# Patient Record
Sex: Male | Born: 1970 | Race: White | Hispanic: No | Marital: Single | Smoking: Heavy tobacco smoker
Health system: Southern US, Community
[De-identification: ages and names within clinical notes are randomized; demographics above are authoritative.]

## PROBLEM LIST (undated history)

## (undated) DIAGNOSIS — K219 Gastro-esophageal reflux disease without esophagitis: Secondary | ICD-10-CM

## (undated) DIAGNOSIS — K922 Gastrointestinal hemorrhage, unspecified: Secondary | ICD-10-CM

## (undated) DIAGNOSIS — M7660 Achilles tendinitis, unspecified leg: Secondary | ICD-10-CM

## (undated) DIAGNOSIS — Z720 Tobacco use: Secondary | ICD-10-CM

## (undated) DIAGNOSIS — K5790 Diverticulosis of intestine, part unspecified, without perforation or abscess without bleeding: Secondary | ICD-10-CM

## (undated) DIAGNOSIS — E785 Hyperlipidemia, unspecified: Secondary | ICD-10-CM

## (undated) HISTORY — PX: NO PAST SURGERIES: SHX2092

---

## 2005-11-12 ENCOUNTER — Observation Stay (HOSPITAL_COMMUNITY): Admission: EM | Admit: 2005-11-12 | Discharge: 2005-11-13 | Payer: Self-pay | Admitting: Emergency Medicine

## 2007-06-14 ENCOUNTER — Ambulatory Visit: Payer: Self-pay

## 2008-05-31 ENCOUNTER — Emergency Department (HOSPITAL_COMMUNITY): Admission: EM | Admit: 2008-05-31 | Discharge: 2008-05-31 | Payer: Self-pay | Admitting: Emergency Medicine

## 2009-04-22 ENCOUNTER — Emergency Department: Payer: Self-pay | Admitting: Emergency Medicine

## 2009-09-16 ENCOUNTER — Emergency Department: Payer: Self-pay | Admitting: Emergency Medicine

## 2010-12-22 ENCOUNTER — Emergency Department: Payer: Self-pay | Admitting: Emergency Medicine

## 2011-02-06 NOTE — Discharge Summary (Signed)
NAME:  Stanley Leach, Stanley Leach       ACCOUNT NO.:  [id number]   MEDICAL RECORD NO.:  [id number]          PATIENT TYPE:  INP   LOCATION:  5504                         FACILITY:  [facility]   PHYSICIAN:  Jackie Plum, M.D.DATE OF BIRTH:  August 01, 1971   DATE OF ADMISSION:  11/11/2005  DATE OF DISCHARGE:  11/13/2005                                 DISCHARGE SUMMARY   DISCHARGE DIAGNOSES:  1.  Urticaria, resolved.      1.  ESR 80 mm/hour on November 11, 2005.  This was not repeated.          Outpatient follow-up recommended.      2.  Hepatitis panel negative.      3.  TSH low at 0.127.  The patient does not have any clinical evidence          of hyperthyroidism and therefore no treatment was initiated for this          patient.  The patient will need outpatient repeat of his TSH and          free T3 and T4 as needed and this has been told to the patient.      4.  Human immunodeficiency virus test was negative. Mono screen was also          negative.  2.  History of cigarette smoking.  3.  Abnormal TSH as noted above, outpatient recommended.   DISCHARGE LABORATORY DATA:  WBC count 10.6, hemoglobin 13.6, hematocrit  37.8, MCV 91, platelets 438.  Sodium 138, potassium 4.2, chloride 105, CO2  27, BUN 8, glucose 140, creatinine 0.9, albumin 3.2, calcium 9.0, phosphorus  3.1, TSH 0.127.   CONSULTANTS:  Not applicable.   PROCEDURE:  Not applicable.   CONDITION ON DISCHARGE:  Improved and satisfactory.   HISTORY OF PRESENT ILLNESS:  The patient presented with complaints of hives;  please see full details regarding H&P as documented by Dr. Lendell Caprice.  It was  not clear the inciting factor for this admission.  The patient had a  temperature of 100.3 and he was mildly tachycardic.  He has had some hives  on his skin which was more macular erythematous lesions and urticarial  pattern.  He was admitted for further evaluation because the patient had  complaint of his throat closing up.  The patient  was started on IV steroids  as well as H1 and H2 blockers with Pepcid and Benadryl.  Overnight, symptoms  improved and by this morning, the patient has not had any skin lesions on  his body.  He has been doing well.  He is deemed ready for discharge today.  The patient does not have a PCP.  He has been asked to follow up with  [facility] or a Airlie Blumenberg of choice in terms of further workup.  He is going  to have a prescription for Epipen, Pepcid, prednisone, and Benadryl for his  urticaria with instructions to use the Epipen if he feels that he is having  difficulty breathing especially with his throat closing up __________.  The  patient is discharged in stable and satisfactory condition.     Jackie Plum, M.D.  Electronically Signed    GO/MEDQ  D:  11/13/2005  T:  11/13/2005  Job:  [id number]

## 2011-02-06 NOTE — H&P (Signed)
NAME:  Stanley Leach, Stanley Leach       ACCOUNT NO.:  [id number]   MEDICAL RECORD NO.:  [id number]          PATIENT TYPE:  EMS   LOCATION:  MAJO                         FACILITY:  [facility]   PHYSICIAN:  Corinna L. Lendell Caprice, MDDATE OF BIRTH:  22-Apr-1971   DATE OF ADMISSION:  11/12/2005  DATE OF DISCHARGE:                                HISTORY & PHYSICAL   PRIMARY CARE PHYSICIAN:  The patient does not have one.   CHIEF COMPLAINT:  Hives.   HISTORY OF PRESENT ILLNESS:  The patient is a 40 year old white male in  general health with occasional tobacco use, who for the last 2-3 weeks has  been having problems with URI symptoms.  He has been doing relatively well,  although he continues to have some mild cough.  His girlfriend has had  similar symptoms and recently got a prescription for amoxicillin, which he  took as well.  The only other recent medication that he has been taking is  some Tylenol PM, but he has taken that before in the past.  His other  previous history is that of approximately 15 years ago he had a single onset  of generalized hives of unknown etiology.  He otherwise has no allergies as  far as he can tell.  Sometime yesterday, the patient started noting that he  started having raised red macular lesions which were pruritic on his elbows  and knees.  He became somewhat concerned, took some Benadryl and this went  away.  He then went to sleep and when he woke up in the morning, his hands  were more stiff and swollen and he became concerned and followed up with  Urgent Care.  When he saw them, his symptoms were improved.  He received  some steroids and Benadryl and symptoms started to improve; however, within  an hour's time, his symptoms started to return again.  This time, they  seemed a bit more prominent, this time involving more of his hands, knees,  thighs, feet and some on his abdomen.  He became concerned and he came into  the emergency room.  He received another dose of  steroids and Benadryl and  went to the emergency room, as was advised by Urgent Care.  In the emergency  room, the patient's symptoms were minimal and essentially asymptomatic at  that point.  He was evaluated by the ER attending physician at [facility].  The patient's labs were essentially unremarkable, although he did  note to have a slightly elevated white count at 12.2 with a 96% shift; the  rest of his labs were noted with just a slight bump in his transaminases,  but he did have an elevated sed rate of 80.  The ER attending was planning  on discharging the patient with followup as an outpatient when the nurse  called the attending back into the room and the patient started having more  prominent rash again.  This time there were no respiratory issues, although  when the patient's second episode happened in the Urgent Care, he started  having problems with what he felt like his throat was closing and that  is  when the Urgent Care advised him to go immediately to the emergency room.  With his new episode in the emergency room itself, which continued to be  pruritic, the ER attending felt it would be best to admit the patient for  further observation.  The patient had a chest x-ray done which showed some  bronchitis changes, but otherwise unremarkable.  Currently, the patient is  doing well.  He denies any headaches, visual changes, dysphagia, chest pain,  palpitations, shortness of breath or wheeze.  He does have a dry cough,  which he says is overall getting better.  He denies any abdominal pain.  No  hematuria, dysuria, constipation, diarrhea, focal extremity numbness,  weakness or pain.  He does complain of this itchy red rash which is along  the lower portion of his belly, minimal traces left on his elbows, still  some prominent lesions with itchiness seen on both his knees and a few  lesions on both his feet.   REVIEW OF SYSTEMS:  His review of systems is otherwise  negative.   PAST MEDICAL HISTORY:  None, other than tobacco use.   MEDICATIONS:  With his recent medications, he has received a couple of doses  of steroids and Benadryl.  He has tried his girlfriend's amoxicillin and he  has been taking some Tylenol PM, although he is normally not on any  medicines.   ALLERGIES:  None.   SOCIAL HISTORY:  He admits to occasionally binge drinking about once a week  and drinking and smoking periodically, but nothing over the last few days.  His food exposure is inconsistent, as even over the last 24 hours with these  episodes occurring, he has not had anything to eat.   FAMILY HISTORY:  Negative.   PHYSICAL EXAMINATION:  VITALS ON ADMISSION:  Temperature 100.3, now down to  99.3; heart rate 117, now down to 91; blood pressure 129/82; respirations  24; O2 SAT 94% on room air.  GENERAL:  The patient is alert and oriented x3, in no apparent distress.  HEENT:  Normocephalic, atraumatic.  His mucous membranes are moist.  He has  no inner mucosa lesion.  NECK:  He has no carotid bruits.  HEART:  Regular rate and rhythm, S1 and S2.  LUNGS:  Clear to auscultation bilaterally.  ABDOMEN:  Soft, nontender and non-distended.  Positive bowel sounds.  EXTREMITIES:  The patient has evidence of small macular erythematous lesions  seen, a few on his elbows, mostly on his knees, as well as on his bilateral  feet, trace pitting edema.   LABORATORY WORK:  White count 12.2, neutrophils 96%, H&H 14.6 and 42.7, MCV  of 89, platelet count 431,000.  Sodium 138, potassium 3.8, chloride 101,  bicarb 24, BUN 10, creatinine 1, glucose 142.  LFTs are only notable for AST  53, ALT 72.  UA unremarkable, except for 100 of glucose.  Sed rate 80.   ASSESSMENT AND PLAN:  1.  Generalized acute urticaria of unknown etiology and recurrent:  It does      not seem to be associated with any type of contact form.  There was no     eosinophil count on his differential and it does not  appear to be drug      related as well.  This possibly could be infection related.  In      reviewing the literature, this may be also an autoimmune possibility as      well, given his  elevated sed rate.  I have ordered an ANA,      cryoglobulins, SPEP, TSH, HIV titer,  mono spot test, Parvovirus and      hepatitis panel.  In the meantime, we will treat with intravenous Solu-      Medrol and Benadryl.  2.  Bronchitis:  We will treat with Avelox.  3.  Tobacco abuse:  Provide counseling.     Hollice Espy, M.D.  Electronically Signed      Corinna L. Lendell Caprice, MD  Electronically Signed   SKK/MEDQ  D:  11/12/2005  T:  11/12/2005  Job:  [id number]

## 2013-01-10 ENCOUNTER — Emergency Department: Payer: Self-pay | Admitting: Emergency Medicine

## 2013-01-10 LAB — BASIC METABOLIC PANEL
BUN: 15 mg/dL (ref 7–18)
Calcium, Total: 8.5 mg/dL (ref 8.5–10.1)
EGFR (Non-African Amer.): >60
Osmolality: 277 (ref 275–301)

## 2013-01-10 LAB — CBC
HCT: 45.1 % (ref 40.0–52.0)
HGB: 15 g/dL (ref 13.0–18.0)
MCHC: 33.3 g/dL (ref 32.0–36.0)
RDW: 13.2 % (ref 11.5–14.5)
WBC: 11.6 10*3/uL — ABNORMAL HIGH (ref 3.8–10.6)

## 2013-01-10 LAB — TROPONIN I: Troponin-I: <0.02 ng/mL

## 2013-01-10 LAB — CK TOTAL AND CKMB (NOT AT ARMC): CK, Total: 175 U/L (ref 35–232)

## 2013-01-11 LAB — CK TOTAL AND CKMB (NOT AT ARMC)
CK, Total: 162 U/L (ref 35–232)
CK-MB: 0.5 ng/mL (ref 0.5–3.6)

## 2013-09-09 ENCOUNTER — Emergency Department: Payer: Self-pay | Admitting: Emergency Medicine

## 2013-09-09 LAB — BASIC METABOLIC PANEL
BUN: 11 mg/dL (ref 7–18)
Calcium, Total: 8.6 mg/dL (ref 8.5–10.1)
Co2: 29 mmol/L (ref 21–32)
Creatinine: 0.97 mg/dL (ref 0.60–1.30)
EGFR (African American): >60
EGFR (Non-African Amer.): >60
Glucose: 72 mg/dL (ref 65–99)

## 2013-09-09 LAB — CBC
HCT: 44.6 % (ref 40.0–52.0)
HGB: 14.9 g/dL (ref 13.0–18.0)
MCH: 29.9 pg (ref 26.0–34.0)
MCHC: 33.4 g/dL (ref 32.0–36.0)
Platelet: 314 10*3/uL (ref 150–440)

## 2013-09-09 LAB — TROPONIN I: Troponin-I: <0.02 ng/mL

## 2013-09-28 ENCOUNTER — Emergency Department: Payer: Self-pay | Admitting: Emergency Medicine

## 2013-09-28 LAB — URINALYSIS, COMPLETE
BILIRUBIN, UR: NEGATIVE
BLOOD: NEGATIVE
Bacteria: NONE SEEN
Glucose,UR: NEGATIVE mg/dL (ref 0–75)
KETONE: NEGATIVE
Nitrite: NEGATIVE
PH: 5 (ref 4.5–8.0)
Protein: NEGATIVE
RBC,UR: <1 /HPF (ref 0–5)
SPECIFIC GRAVITY: 1.012 (ref 1.003–1.030)
Squamous Epithelial: <1
WBC UR: 2 /HPF (ref 0–5)

## 2014-03-22 ENCOUNTER — Emergency Department: Payer: Self-pay | Admitting: Emergency Medicine

## 2014-03-23 ENCOUNTER — Emergency Department: Payer: Self-pay | Admitting: Emergency Medicine

## 2014-09-23 ENCOUNTER — Emergency Department: Payer: Self-pay | Admitting: Emergency Medicine

## 2014-09-23 LAB — BASIC METABOLIC PANEL
ANION GAP: 8 (ref 7–16)
BUN: 10 mg/dL (ref 7–18)
CHLORIDE: 100 mmol/L (ref 98–107)
Calcium, Total: 8.8 mg/dL (ref 8.5–10.1)
Co2: 29 mmol/L (ref 21–32)
Creatinine: 1.04 mg/dL (ref 0.60–1.30)
EGFR (Non-African Amer.): >60
Glucose: 93 mg/dL (ref 65–99)
OSMOLALITY: 273 (ref 275–301)
POTASSIUM: 4.1 mmol/L (ref 3.5–5.1)
SODIUM: 137 mmol/L (ref 136–145)

## 2014-09-23 LAB — TROPONIN I
Troponin-I: <0.02 ng/mL
Troponin-I: <0.02 ng/mL

## 2014-09-23 LAB — CBC
HCT: 46.7 % (ref 40.0–52.0)
HGB: 15.5 g/dL (ref 13.0–18.0)
MCH: 30.4 pg (ref 26.0–34.0)
MCHC: 33.1 g/dL (ref 32.0–36.0)
MCV: 92 fL (ref 80–100)
Platelet: 343 10*3/uL (ref 150–440)
RBC: 5.1 10*6/uL (ref 4.40–5.90)
RDW: 13 % (ref 11.5–14.5)
WBC: 11.2 10*3/uL — AB (ref 3.8–10.6)

## 2014-10-09 ENCOUNTER — Emergency Department: Payer: Self-pay | Admitting: Emergency Medicine

## 2015-01-07 ENCOUNTER — Emergency Department
Admit: 2015-01-07 | Disposition: A | Discharge status: Home / Self Care | Payer: Self-pay | Admitting: Emergency Medicine

## 2015-08-03 ENCOUNTER — Encounter: Payer: Self-pay | Admitting: Emergency Medicine

## 2015-08-03 ENCOUNTER — Emergency Department: Payer: Self-pay

## 2015-08-03 ENCOUNTER — Emergency Department
Admission: EM | Admit: 2015-08-03 | Discharge: 2015-08-03 | Disposition: A | Discharge status: Home / Self Care | Payer: Self-pay | Attending: Emergency Medicine | Admitting: Emergency Medicine

## 2015-08-03 DIAGNOSIS — Y9389 Activity, other specified: Secondary | ICD-10-CM | POA: Insufficient documentation

## 2015-08-03 DIAGNOSIS — S4992XA Unspecified injury of left shoulder and upper arm, initial encounter: Secondary | ICD-10-CM | POA: Insufficient documentation

## 2015-08-03 DIAGNOSIS — X58XXXA Exposure to other specified factors, initial encounter: Secondary | ICD-10-CM | POA: Insufficient documentation

## 2015-08-03 DIAGNOSIS — M25512 Pain in left shoulder: Secondary | ICD-10-CM

## 2015-08-03 DIAGNOSIS — Z720 Tobacco use: Secondary | ICD-10-CM | POA: Insufficient documentation

## 2015-08-03 DIAGNOSIS — Y998 Other external cause status: Secondary | ICD-10-CM | POA: Insufficient documentation

## 2015-08-03 DIAGNOSIS — Y9289 Other specified places as the place of occurrence of the external cause: Secondary | ICD-10-CM | POA: Insufficient documentation

## 2015-08-03 MED ORDER — HYDROCODONE-ACETAMINOPHEN 5-325 MG PO TABS: 1.0000 | ORAL_TABLET | ORAL | Status: DC | PRN

## 2015-08-03 MED ORDER — IBUPROFEN 800 MG PO TABS: 800.0000 mg | ORAL_TABLET | Freq: Three times a day (TID) | ORAL | Status: DC | PRN

## 2015-08-03 NOTE — ED Provider Notes (Signed)
[facility] Emergency Department Provider Note  ____________________________________________  Time seen: Approximately 6:07 PM  I have reviewed the triage vital signs and the nursing notes.   HISTORY  Chief Complaint Shoulder Pain    HPI Stanley Leach is a 44 y.o. male with complaint of left shoulder pain for approximately 3 months. Today his friend pulled the left shoulder forward treating increasing pain and tingling into the forearm and hand. He has pain with range of motion of the left shoulder.He denies injury to the left elbow, left wrist or neck. He has full movement of his head.   History reviewed. No pertinent past medical history.  There are no active problems to display for this patient.   History reviewed. No pertinent past surgical history.  Current Outpatient Rx  Name  Route  Sig  Dispense  Refill  . HYDROcodone-acetaminophen (NORCO) 5-325 MG tablet   Oral   Take 1 tablet by mouth every 4 (four) hours as needed for moderate pain.   20 tablet   0   . ibuprofen (ADVIL,MOTRIN) 800 MG tablet   Oral   Take 1 tablet (800 mg total) by mouth every 8 (eight) hours as needed.   15 tablet   0     Allergies Review of patient's allergies indicates no known allergies.  History reviewed. No pertinent family history.  Social History Social History  Substance Use Topics  . Smoking status: Heavy Tobacco Smoker -- 0.50 packs/day  . Smokeless tobacco: None  . Alcohol Use: No    Review of Systems Constitutional: No fever/chills Eyes: No visual changes. ENT: No sore throat. Cardiovascular: Denies chest pain. Respiratory: Denies shortness of breath. Gastrointestinal: No abdominal pain.   Skin: Negative for rash. Neurological: Negative for headaches, focal weakness or numbness.  10-point ROS otherwise negative.  ____________________________________________   PHYSICAL EXAM:  VITAL SIGNS: ED Triage Vitals  Enc Vitals  Group     BP 08/03/15 1644 128/91 mmHg     Pulse Rate 08/03/15 1644 64     Resp 08/03/15 1644 16     Temp 08/03/15 1644 98.3 F (36.8 C)     Temp src --      SpO2 08/03/15 1644 97 %     Weight 08/03/15 1644 235 lb (106.595 kg)     Height 08/03/15 1644  (1.88 m)     Head Cir --      Peak Flow --      Pain Score 08/03/15 1644 4     Pain Loc --      Pain Edu? --      Excl. in GC? --     Constitutional: Alert and oriented. Well appearing and in no acute distress. Eyes: Conjunctivae are normal. EOMI. Head: Atraumatic. Nose: No congestion/rhinnorhea. Mouth/Throat: Mucous membranes are moist.  Oropharynx non-erythematous. Neck: No stridor.  No cervical spine tenderness to palpation. Hematological/Lymphatic/Immunilogical: No cervical lymphadenopathy. Cardiovascular: Normal rate, regular rhythm.  Respiratory: Normal respiratory effort.  Gastrointestinal: Soft and nontender. No distention. No abdominal bruits. No CVA tenderness. Musculoskeletal: No lower extremity tenderness nor edema.  No joint effusions. Left shoulder: limited ROM to 60 deg until painful, in forward/lateral abduction;  Has limited ability to internal rotate. Left wrist and elbow: nml exam Neurologic:  Normal speech and language. No gross focal neurologic deficits are appreciated. No gait instability. Skin:  Skin is warm, dry and intact. No rash noted. Psychiatric: Mood and affect are normal. Speech and behavior are normal.  ____________________________________________  LABS (all labs ordered are listed, but only abnormal results are displayed)  Labs Reviewed - No data to display ____________________________________________  EKG   ____________________________________________  RADIOLOGY  Study Result     CLINICAL DATA: Chronic left shoulder pain 3 months. No injury.  EXAM: LEFT SHOULDER - 2+ VIEW  COMPARISON: Chest x-ray 09/23/2014  FINDINGS: There is no evidence of fracture or  dislocation. There is no evidence of arthropathy or other focal bone abnormality. Soft tissues are unremarkable.  IMPRESSION: Negative.   Electronically Signed  By: Elberta Fortisaniel Boyle M.D.  On: 08/03/2015 17:53    ____________________________________________   PROCEDURES  Procedure(s) performed: None  Critical Care performed: No  ____________________________________________   INITIAL IMPRESSION / ASSESSMENT AND PLAN / ED COURSE  Pertinent labs & imaging results that were available during my care of the patient were reviewed by me and considered in my medical decision making (see chart for details).  44 year old male with 3 month history of left shoulder pain, focally around the [facility] joint and lateral clavicle.  normal x-ray today. Suspect a ligament strain of the [facility] JOINT with likely rotator cuff strain as well. Placed in a shoulder sling today. Given ibuprofen and a few Norco. He will follow-up with orthopedist for further evaluation.  ____________________________________________   FINAL CLINICAL IMPRESSION(S) / ED DIAGNOSES  Final diagnoses:  Shoulder pain, acute, left      Ignacia BayleyRobert Amare Bail, PA-C 08/03/15 1823  Ignacia Bayleyobert Deidrick Rainey, PA-C 08/03/15 1912  Emily FilbertJonathan E Williams, MD 08/03/15 [phone]

## 2015-08-03 NOTE — Discharge Instructions (Signed)
Shoulder Pain  The shoulder is the joint that connects your arms to your body. The bones that form the shoulder joint include the upper arm bone (humerus), the shoulder blade (scapula), and the collarbone (clavicle). The top of the humerus is shaped like a ball and fits into a rather flat socket on the scapula (glenoid cavity). A combination of muscles and strong, fibrous tissues that connect muscles to bones (tendons) support your shoulder joint and hold the ball in the socket. Small, fluid-filled sacs (bursae) are located in different areas of the joint. They act as cushions between the bones and the overlying soft tissues and help reduce friction between the gliding tendons and the bone as you move your arm. Your shoulder joint allows a wide range of motion in your arm. This range of motion allows you to do things like scratch your back or throw a ball. However, this range of motion also makes your shoulder more prone to pain from overuse and injury.  Causes of shoulder pain can originate from both injury and overuse and usually can be grouped in the following four categories:  1. Redness, swelling, and pain (inflammation) of the tendon (tendinitis) or the bursae (bursitis).  2. Instability, such as a dislocation of the joint.  3. Inflammation of the joint (arthritis).  4. Broken bone (fracture).  HOME CARE INSTRUCTIONS   1. Apply ice to the sore area.  ¨ Put ice in a plastic bag.  ¨ Place a towel between your skin and the bag.  ¨ Leave the ice on for 15-20 minutes, 3-4 times per day for the first 2 days, or as directed by your health care provider.  2. Stop using cold packs if they do not help with the pain.  3. If you have a shoulder sling or immobilizer, wear it as long as your caregiver instructs. Only remove it to shower or bathe. Move your arm as little as possible, but keep your hand moving to prevent swelling.  4. Squeeze a soft ball or foam pad as much as possible to help prevent swelling.  5. Only take  over-the-counter or prescription medicines for pain, discomfort, or fever as directed by your caregiver.  SEEK MEDICAL CARE IF:   1. Your shoulder pain increases, or new pain develops in your arm, hand, or fingers.  2. Your hand or fingers become cold and numb.  3. Your pain is not relieved with medicines.  SEEK IMMEDIATE MEDICAL CARE IF:   1. Your arm, hand, or fingers are numb or tingling.  2. Your arm, hand, or fingers are significantly swollen or turn white or blue.  MAKE SURE YOU:   1. Understand these instructions.  2. Will watch your condition.  3. Will get help right away if you are not doing well or get worse.     This information is not intended to replace advice given to you by your health care provider. Make sure you discuss any questions you have with your health care provider.     Document Released: 06/17/2005 Document Revised: 09/28/2014 Document Reviewed: 12/31/2014  Elsevier Interactive Patient Education ©2016 [employer]  Shoulder Range of Motion Exercises  Shoulder range of motion (ROM) exercises are designed to keep the shoulder moving freely. They are often recommended for people who have shoulder pain.  MOVEMENT EXERCISE  When you are able, do this exercise 5-6 days per week, or as told by your health care provider. Work toward doing 2 sets of 10 swings.  Pendulum   Exercise  How To Do This Exercise Lying Down  5. Lie face-down on a bed with your abdomen close to the side of the bed.  6. Let your arm hang over the side of the bed.  7. Relax your shoulder, arm, and hand.  8. Slowly and gently swing your arm forward and back. Do not use your neck muscles to swing your arm. They should be relaxed. If you are struggling to swing your arm, have someone gently swing it for you. When you do this exercise for the first time, swing your arm at a 15 degree angle for 15 seconds, or swing your arm 10 times. As pain lessens over time, increase the angle of the swing to 30-45 degrees.  9. Repeat steps 1-4  with the other arm.  How To Do This Exercise While Standing  6. Stand next to a sturdy chair or table and hold on to it with your hand.  ¨ Bend forward at the waist.  ¨ Bend your knees slightly.  ¨ Relax your other arm and let it hang limp.  ¨ Relax the shoulder blade of the arm that is hanging and let it drop.  ¨ While keeping your shoulder relaxed, use body motion to swing your arm in small circles. The first time you do this exercise, swing your arm for about 30 seconds or 10 times. When you do it next time, swing your arm for a little longer.  ¨ Stand up tall and relax.  ¨ Repeat steps 1-7, this time changing the direction of the circles.  7. Repeat steps 1-8 with the other arm.  STRETCHING EXERCISES  Do these exercises 3-4 times per day on 5-6 days per week or as told by your health care provider. Work toward holding the stretch for 20 seconds.  Stretching Exercise 1  4. Lift your arm straight out in front of you.  5. Bend your arm 90 degrees at the elbow (right angle) so your forearm goes across your body and looks like the letter "L."  6. Use your other arm to gently pull the elbow forward and across your body.  7. Repeat steps 1-3 with the other arm.  Stretching Exercise 2  You will need a towel or rope for this exercise.  3. Bend one arm behind your back with the palm facing outward.  4. Hold a towel with your other hand.  5. Reach the arm that holds the towel above your head, and bend that arm at the elbow. Your wrist should be behind your neck.  6. Use your free hand to grab the free end of the towel.  7. With the higher hand, gently pull the towel up behind you.  8. With the lower hand, pull the towel down behind you.  9. Repeat steps 1-6 with the other arm.  STRENGTHENING EXERCISES  Do each of these exercises at four different times of day (sessions) every day or as told by your health care provider. To begin with, repeat each exercise 5 times (repetitions). Work toward doing 3 sets of 12 repetitions or  as told by your health care provider.  Strengthening Exercise 1  You will need a light weight for this activity. As you grow stronger, you may use a heavier weight.  4. Standing with a weight in your hand, lift your arm straight out to the side until it is at the same height as your shoulder.  5. Bend your arm at 90 degrees so that your   front of you, then straight up over your head. 2. Gradually move your other arm in an arc, starting at your side, then out in front of you, then straight up over your head. 3. Repeat steps 1-2 with the other arm. Strengthening Exercise 3 You will need an elastic band for this activity. As you grow stronger, gradually increase the size of the bands or increase the number of bands that you use at one time. 1. While standing, hold an elastic band in one hand and raise that arm up in the air. 2. With your other hand, pull down the band until that hand is by your side. 3. Repeat steps 1-2 with the other arm.   This information is not intended to replace advice given to you by your health care provider. Make sure you discuss any questions you have with your health care provider.   Document Released: 06/06/2003 Document Revised: 01/22/2015 Document Reviewed: 09/03/2014 Elsevier Interactive Patient Education 2016 [employer] Inc[employer]   Take pain medicine as directed. Follow-up with the orthopedist if not improving. Begin exercises as tolerated when pain improves.

## 2015-08-03 NOTE — ED Notes (Signed)
States girlfriend pulled his arm and since then has had pain and states hand feels tingly. States previous shoulder pain and stiffness with no diagnosis

## 2015-08-21 ENCOUNTER — Encounter: Payer: Self-pay | Admitting: Emergency Medicine

## 2015-08-21 ENCOUNTER — Emergency Department: Payer: Self-pay

## 2015-08-21 ENCOUNTER — Emergency Department
Admission: EM | Admit: 2015-08-21 | Discharge: 2015-08-21 | Disposition: A | Discharge status: Home / Self Care | Payer: Self-pay | Attending: Emergency Medicine | Admitting: Emergency Medicine

## 2015-08-21 DIAGNOSIS — R42 Dizziness and giddiness: Secondary | ICD-10-CM | POA: Insufficient documentation

## 2015-08-21 DIAGNOSIS — F17200 Nicotine dependence, unspecified, uncomplicated: Secondary | ICD-10-CM | POA: Insufficient documentation

## 2015-08-21 DIAGNOSIS — R079 Chest pain, unspecified: Secondary | ICD-10-CM | POA: Insufficient documentation

## 2015-08-21 DIAGNOSIS — M79602 Pain in left arm: Secondary | ICD-10-CM | POA: Insufficient documentation

## 2015-08-21 DIAGNOSIS — R202 Paresthesia of skin: Secondary | ICD-10-CM | POA: Insufficient documentation

## 2015-08-21 DIAGNOSIS — R6884 Jaw pain: Secondary | ICD-10-CM | POA: Insufficient documentation

## 2015-08-21 LAB — COMPREHENSIVE METABOLIC PANEL
ALBUMIN: 4.2 g/dL (ref 3.5–5.0)
ALT: 25 U/L (ref 17–63)
AST: 23 U/L (ref 15–41)
Alkaline Phosphatase: 59 U/L (ref 38–126)
Anion gap: 9 (ref 5–15)
BUN: 18 mg/dL (ref 6–20)
CHLORIDE: 103 mmol/L (ref 101–111)
CO2: 27 mmol/L (ref 22–32)
Calcium: 9.4 mg/dL (ref 8.9–10.3)
Creatinine, Ser: 1 mg/dL (ref 0.61–1.24)
GFR calc Af Amer: >60 mL/min (ref >60)
GFR calc non Af Amer: >60 mL/min (ref >60)
GLUCOSE: 106 mg/dL — AB (ref 65–99)
POTASSIUM: 3.9 mmol/L (ref 3.5–5.1)
SODIUM: 139 mmol/L (ref 135–145)
Total Bilirubin: 0.3 mg/dL (ref 0.3–1.2)
Total Protein: 7.3 g/dL (ref 6.5–8.1)

## 2015-08-21 LAB — CBC
HCT: 42 % (ref 40.0–52.0)
HEMOGLOBIN: 14.2 g/dL (ref 13.0–18.0)
MCH: 30.6 pg (ref 26.0–34.0)
MCHC: 33.8 g/dL (ref 32.0–36.0)
MCV: 90.6 fL (ref 80.0–100.0)
Platelets: 305 10*3/uL (ref 150–440)
RBC: 4.63 MIL/uL (ref 4.40–5.90)
RDW: 13.6 % (ref 11.5–14.5)
WBC: 10.5 10*3/uL (ref 3.8–10.6)

## 2015-08-21 LAB — TROPONIN I

## 2015-08-21 MED ORDER — LORAZEPAM 1 MG PO TABS: 1.0000 mg | ORAL_TABLET | Freq: Two times a day (BID) | ORAL | Status: DC

## 2015-08-21 NOTE — Discharge Instructions (Signed)
Nonspecific Chest Pain  °Chest pain can be caused by many different conditions. There is always a chance that your pain could be related to something serious, such as a heart attack or a blood clot in your lungs. Chest pain can also be caused by conditions that are not life-threatening. If you have chest pain, it is very important to follow up with your health care provider. °CAUSES  °Chest pain can be caused by: °· Heartburn. °· Pneumonia or bronchitis. °· Anxiety or stress. °· Inflammation around your heart (pericarditis) or lung (pleuritis or pleurisy). °· A blood clot in your lung. °· A collapsed lung (pneumothorax). It can develop suddenly on its own (spontaneous pneumothorax) or from trauma to the chest. °· Shingles infection (varicella-zoster virus). °· Heart attack. °· Damage to the bones, muscles, and cartilage that make up your chest wall. This can include: °¨ Bruised bones due to injury. °¨ Strained muscles or cartilage due to frequent or repeated coughing or overwork. °¨ Fracture to one or more ribs. °¨ Sore cartilage due to inflammation (costochondritis). °RISK FACTORS  °Risk factors for chest pain may include: °· Activities that increase your risk for trauma or injury to your chest. °· Respiratory infections or conditions that cause frequent coughing. °· Medical conditions or overeating that can cause heartburn. °· Heart disease or family history of heart disease. °· Conditions or health behaviors that increase your risk of developing a blood clot. °· Having had chicken pox (varicella zoster). °SIGNS AND SYMPTOMS °Chest pain can feel like: °· Burning or tingling on the surface of your chest or deep in your chest. °· Crushing, pressure, aching, or squeezing pain. °· Dull or sharp pain that is worse when you move, cough, or take a deep breath. °· Pain that is also felt in your back, neck, shoulder, or arm, or pain that spreads to any of these areas. °Your chest pain may come and go, or it may stay  constant. °DIAGNOSIS °Lab tests or other studies may be needed to find the cause of your pain. Your health care provider may have you take a test called an ambulatory ECG (electrocardiogram). An ECG records your heartbeat patterns at the time the test is performed. You may also have other tests, such as: °· Transthoracic echocardiogram (TTE). During echocardiography, sound waves are used to create a picture of all of the heart structures and to look at how blood flows through your heart. °· Transesophageal echocardiogram (TEE). This is a more advanced imaging test that obtains images from inside your body. It allows your health care provider to see your heart in finer detail. °· Cardiac monitoring. This allows your health care provider to monitor your heart rate and rhythm in real time. °· Holter monitor. This is a portable device that records your heartbeat and can help to diagnose abnormal heartbeats. It allows your health care provider to track your heart activity for several days, if needed. °· Stress tests. These can be done through exercise or by taking medicine that makes your heart beat more quickly. °· Blood tests. °· Imaging tests. °TREATMENT  °Your treatment depends on what is causing your chest pain. Treatment may include: °· Medicines. These may include: °¨ Acid blockers for heartburn. °¨ Anti-inflammatory medicine. °¨ Pain medicine for inflammatory conditions. °¨ Antibiotic medicine, if an infection is present. °¨ Medicines to dissolve blood clots. °¨ Medicines to treat coronary artery disease. °· Supportive care for conditions that do not require medicines. This may include: °¨ Resting. °¨ Applying heat   or cold packs to injured areas. °¨ Limiting activities until pain decreases. °HOME CARE INSTRUCTIONS °· If you were prescribed an antibiotic medicine, finish it all even if you start to feel better. °· Avoid any activities that bring on chest pain. °· Do not use any tobacco products, including  cigarettes, chewing tobacco, or electronic cigarettes. If you need help quitting, ask your health care provider. °· Do not drink alcohol. °· Take medicines only as directed by your health care provider. °· Keep all follow-up visits as directed by your health care provider. This is important. This includes any further testing if your chest pain does not go away. °· If heartburn is the cause for your chest pain, you may be told to keep your head raised (elevated) while sleeping. This reduces the chance that acid will go from your stomach into your esophagus. °· Make lifestyle changes as directed by your health care provider. These may include: °¨ Getting regular exercise. Ask your health care provider to suggest some activities that are safe for you. °¨ Eating a heart-healthy diet. A registered dietitian can help you to learn healthy eating options. °¨ Maintaining a healthy weight. °¨ Managing diabetes, if necessary. °¨ Reducing stress. °SEEK MEDICAL CARE IF: °· Your chest pain does not go away after treatment. °· You have a rash with blisters on your chest. °· You have a fever. °SEEK IMMEDIATE MEDICAL CARE IF:  °· Your chest pain is worse. °· You have an increasing cough, or you cough up blood. °· You have severe abdominal pain. °· You have severe weakness. °· You faint. °· You have chills. °· You have sudden, unexplained chest discomfort. °· You have sudden, unexplained discomfort in your arms, back, neck, or jaw. °· You have shortness of breath at any time. °· You suddenly start to sweat, or your skin gets clammy. °· You feel nauseous or you vomit. °· You suddenly feel light-headed or dizzy. °· Your heart begins to beat quickly, or it feels like it is skipping beats. °These symptoms may represent a serious problem that is an emergency. Do not wait to see if the symptoms will go away. Get medical help right away. Call your local emergency services (911 in the [place] Do not drive yourself to the hospital. °  °This  information is not intended to replace advice given to you by your health care provider. Make sure you discuss any questions you have with your health care provider. °  °Document Released: 06/17/2005 Document Revised: 09/28/2014 Document Reviewed: 04/13/2014 °[employer]vier Interactive Patient Education ©2016 [employer] ° °

## 2015-08-21 NOTE — ED Notes (Signed)
Do not sent urine culture per Dr. Mayford KnifeWilliams.

## 2015-08-21 NOTE — ED Notes (Signed)
Pt arrived to the ED for complaints of chest pain x1 day. Pt states that he was at work when he began to experience chest pain that radiated to the left arm and jaw. Pt states that the pain goes on and off. Pt is AOx4 in no apparent distress during triage.

## 2015-08-21 NOTE — ED Provider Notes (Signed)
[facility] Emergency Department Provider Note     Time seen: ----------------------------------------- 9:05 PM on 08/21/2015 -----------------------------------------    I have reviewed the triage vital signs and the nursing notes.   HISTORY  Chief Complaint Chest Pain    HPI Stanley Leach is a 44 y.o. male who presents ER for chest pain for 24 hours.Patient states he was at work when he began to experience sharp pinpoint chest pain. This was accompanied by some left arm pain and jaw pain. Patient states the pain comes and goes. He denies fevers chills or other complaints. Has never had pain like this before.   History reviewed. No pertinent past medical history.  There are no active problems to display for this patient.   History reviewed. No pertinent past surgical history.  Allergies Review of patient's allergies indicates no known allergies.  Social History Social History  Substance Use Topics  . Smoking status: Heavy Tobacco Smoker -- 0.50 packs/day  . Smokeless tobacco: None  . Alcohol Use: Yes    Review of Systems Constitutional: Negative for fever. Eyes: Negative for visual changes. ENT: Negative for sore throat. Cardiovascular: Positive for chest pain Respiratory: Negative for shortness of breath. Gastrointestinal: Negative for abdominal pain, vomiting and diarrhea. Genitourinary: Negative for dysuria. Musculoskeletal: Negative for back pain. Skin: Negative for rash. Neurological: Negative for headaches, focal weakness or numbness. Positive for tingling and dizziness  10-point ROS otherwise negative.  ____________________________________________   PHYSICAL EXAM:  VITAL SIGNS: ED Triage Vitals  Enc Vitals Group     BP 08/21/15 2036 143/91 mmHg     Pulse Rate 08/21/15 2036 82     Resp 08/21/15 2036 18     Temp 08/21/15 2036 98.3 F (36.8 C)     Temp Source 08/21/15 2036 Oral     SpO2 08/21/15 2033 98 %    Weight 08/21/15 2036 252 lb (114.306 kg)     Height 08/21/15 2036  (1.88 m)     Head Cir --      Peak Flow --      Pain Score 08/21/15 2037 1     Pain Loc --      Pain Edu? --      Excl. in GC? --     Constitutional: Alert and oriented. Well appearing and in no distress. Eyes: Conjunctivae are normal. PERRL. Normal extraocular movements. ENT   Head: Normocephalic and atraumatic.   Nose: No congestion/rhinnorhea.   Mouth/Throat: Mucous membranes are moist.   Neck: No stridor. Cardiovascular: Normal rate, regular rhythm. Normal and symmetric distal pulses are present in all extremities. No murmurs, rubs, or gallops. Respiratory: Normal respiratory effort without tachypnea nor retractions. Breath sounds are clear and equal bilaterally. No wheezes/rales/rhonchi. Gastrointestinal: Soft and nontender. No distention. No abdominal bruits.  Musculoskeletal: Nontender with normal range of motion in all extremities. No joint effusions.  No lower extremity tenderness nor edema. Neurologic:  Normal speech and language. No gross focal neurologic deficits are appreciated. Speech is normal. No gait instability. Skin:  Skin is warm, dry and intact. No rash noted. Psychiatric: Mood and affect are normal. Speech and behavior are normal. Patient exhibits appropriate insight and judgment. ____________________________________________  EKG: Interpreted by me. Normal sinus rhythm with normal axis normal intervals, no evidence of hypertrophy or acute infarction. Rate is 73 bpm  ____________________________________________  ED COURSE:  Pertinent labs & imaging results that were available during my care of the patient were reviewed by me and considered in  my medical decision making (see chart for details). Patient is low risk for ACS, will check basic labs and chest x-ray ____________________________________________    LABS (pertinent positives/negatives)  Labs Reviewed  COMPREHENSIVE  METABOLIC PANEL - Abnormal; Notable for the following:    Glucose, Bld 106 (*)    All other components within normal limits  CBC  TROPONIN I    RADIOLOGY  Chest x-ray is unremarkable  ____________________________________________  FINAL ASSESSMENT AND PLAN  Chest pain  Plan: Patient with labs and imaging as dictated above. Patient admits to being under a lot of stress. This possibly anxiety related, heart score is low risk for ACS. He is stable for outpatient follow-up.    Emily FilbertWilliams, Jonathan E, MD   Emily FilbertJonathan E Williams, MD 08/21/15 [id number]

## 2016-01-06 ENCOUNTER — Emergency Department: Payer: Self-pay

## 2016-01-06 ENCOUNTER — Emergency Department
Admission: EM | Admit: 2016-01-06 | Discharge: 2016-01-06 | Disposition: A | Discharge status: Home / Self Care | Payer: Self-pay | Attending: Emergency Medicine | Admitting: Emergency Medicine

## 2016-01-06 ENCOUNTER — Encounter: Payer: Self-pay | Admitting: Emergency Medicine

## 2016-01-06 DIAGNOSIS — F17200 Nicotine dependence, unspecified, uncomplicated: Secondary | ICD-10-CM | POA: Insufficient documentation

## 2016-01-06 DIAGNOSIS — R079 Chest pain, unspecified: Secondary | ICD-10-CM

## 2016-01-06 DIAGNOSIS — Z791 Long term (current) use of non-steroidal anti-inflammatories (NSAID): Secondary | ICD-10-CM | POA: Insufficient documentation

## 2016-01-06 DIAGNOSIS — R42 Dizziness and giddiness: Secondary | ICD-10-CM

## 2016-01-06 DIAGNOSIS — Z79899 Other long term (current) drug therapy: Secondary | ICD-10-CM | POA: Insufficient documentation

## 2016-01-06 DIAGNOSIS — R0789 Other chest pain: Secondary | ICD-10-CM | POA: Insufficient documentation

## 2016-01-06 LAB — BASIC METABOLIC PANEL
Anion gap: 6 (ref 5–15)
BUN: 16 mg/dL (ref 6–20)
CALCIUM: 9.2 mg/dL (ref 8.9–10.3)
CHLORIDE: 103 mmol/L (ref 101–111)
CO2: 26 mmol/L (ref 22–32)
CREATININE: 1.02 mg/dL (ref 0.61–1.24)
Glucose, Bld: 110 mg/dL — ABNORMAL HIGH (ref 65–99)
Potassium: 3.8 mmol/L (ref 3.5–5.1)
SODIUM: 135 mmol/L (ref 135–145)

## 2016-01-06 LAB — TROPONIN I

## 2016-01-06 LAB — FIBRIN DERIVATIVES D-DIMER (ARMC ONLY): FIBRIN DERIVATIVES D-DIMER (ARMC): 277 (ref 0–499)

## 2016-01-06 LAB — CBC
HCT: 43.9 % (ref 40.0–52.0)
Hemoglobin: 15 g/dL (ref 13.0–18.0)
MCH: 30.5 pg (ref 26.0–34.0)
MCHC: 34.1 g/dL (ref 32.0–36.0)
MCV: 89.3 fL (ref 80.0–100.0)
PLATELETS: 310 10*3/uL (ref 150–440)
RBC: 4.91 MIL/uL (ref 4.40–5.90)
RDW: 13.1 % (ref 11.5–14.5)
WBC: 8.4 10*3/uL (ref 3.8–10.6)

## 2016-01-06 NOTE — Discharge Instructions (Signed)
You were evaluated for dizziness, for which no certain cause was found, but I am most suspicious may be related to seasonal allergies/sinus pressure. However for complete evaluation of the heart rate, you are being referred to the cardiologist office tomorrow.  You were evaluated for intermittent chest discomfort and shortness of breath cough which no certain cause was found, but I am most suspicious that the chest discomfort may actually be related to the indigestion, and your shortness of breath may be related to sleep apnea. Follow with primary care physician for further evaluation and management and diagnosis.  Return to emergency department for any worsening condition including any new or worsening chest pain, chest pain with breathing, shortness of breath or trouble breathing, fever, headache, weakness or numbness, confusion or altered mental status, or any other symptoms concerning to you.   Dizziness Dizziness is a common problem. It is a feeling of unsteadiness or light-headedness. You may feel like you are about to faint. Dizziness can lead to injury if you stumble or fall. Anyone can become dizzy, but dizziness is more common in older adults. This condition can be caused by a number of things, including medicines, dehydration, or illness. HOME CARE INSTRUCTIONS Taking these steps may help with your condition: Eating and Drinking  Drink enough fluid to keep your urine clear or pale yellow. This helps to keep you from becoming dehydrated. Try to drink more clear fluids, such as water.  Do not drink alcohol.  Limit your caffeine intake if directed by your health care provider.  Limit your salt intake if directed by your health care provider. Activity  Avoid making quick movements.  Rise slowly from chairs and steady yourself until you feel okay.  In the morning, first sit up on the side of the bed. When you feel okay, stand slowly while you hold onto something until you know that  your balance is fine.  Move your legs often if you need to stand in one place for a long time. Tighten and relax your muscles in your legs while you are standing.  Do not drive or operate heavy machinery if you feel dizzy.  Avoid bending down if you feel dizzy. Place items in your home so that they are easy for you to reach without leaning over. Lifestyle  Do not use any tobacco products, including cigarettes, chewing tobacco, or electronic cigarettes. If you need help quitting, ask your health care provider.  Try to reduce your stress level, such as with yoga or meditation. Talk with your health care provider if you need help. General Instructions  Watch your dizziness for any changes.  Take medicines only as directed by your health care provider. Talk with your health care provider if you think that your dizziness is caused by a medicine that you are taking.  Tell a friend or a family member that you are feeling dizzy. If he or she notices any changes in your behavior, have this person call your health care provider.  Keep all follow-up visits as directed by your health care provider. This is important. SEEK MEDICAL CARE IF:  Your dizziness does not go away.  Your dizziness or light-headedness gets worse.  You feel nauseous.  You have reduced hearing.  You have new symptoms.  You are unsteady on your feet or you feel like the room is spinning. SEEK IMMEDIATE MEDICAL CARE IF:  You vomit or have diarrhea and are unable to eat or drink anything.  You have problems talking, walking,  swallowing, or using your arms, hands, or legs.  You feel generally weak.  You are not thinking clearly or you have trouble forming sentences. It may take a friend or family member to notice this.  You have chest pain, abdominal pain, shortness of breath, or sweating.  Your vision changes.  You notice any bleeding.  You have a headache.  You have neck pain or a stiff neck.  You have a  fever.   This information is not intended to replace advice given to you by your health care provider. Make sure you discuss any questions you have with your health care provider.   Document Released: 03/03/2001 Document Revised: 01/22/2015 Document Reviewed: 09/03/2014 Elsevier Interactive Patient Education 2016 [employer] Inc[employer]  Nonspecific Chest Pain  Chest pain can be caused by many different conditions. There is always a chance that your pain could be related to something serious, such as a heart attack or a blood clot in your lungs. Chest pain can also be caused by conditions that are not life-threatening. If you have chest pain, it is very important to follow up with your health care provider. CAUSES  Chest pain can be caused by:  Heartburn.  Pneumonia or bronchitis.  Anxiety or stress.  Inflammation around your heart (pericarditis) or lung (pleuritis or pleurisy).  A blood clot in your lung.  A collapsed lung (pneumothorax). It can develop suddenly on its own (spontaneous pneumothorax) or from trauma to the chest.  Shingles infection (varicella-zoster virus).  Heart attack.  Damage to the bones, muscles, and cartilage that make up your chest wall. This can include:  Bruised bones due to injury.  Strained muscles or cartilage due to frequent or repeated coughing or overwork.  Fracture to one or more ribs.  Sore cartilage due to inflammation (costochondritis). RISK FACTORS  Risk factors for chest pain may include:  Activities that increase your risk for trauma or injury to your chest.  Respiratory infections or conditions that cause frequent coughing.  Medical conditions or overeating that can cause heartburn.  Heart disease or family history of heart disease.  Conditions or health behaviors that increase your risk of developing a blood clot.  Having had chicken pox (varicella zoster). SIGNS AND SYMPTOMS Chest pain can feel like:  Burning or tingling on the  surface of your chest or deep in your chest.  Crushing, pressure, aching, or squeezing pain.  Dull or sharp pain that is worse when you move, cough, or take a deep breath.  Pain that is also felt in your back, neck, shoulder, or arm, or pain that spreads to any of these areas. Your chest pain may come and go, or it may stay constant. DIAGNOSIS Lab tests or other studies may be needed to find the cause of your pain. Your health care provider may have you take a test called an ambulatory ECG (electrocardiogram). An ECG records your heartbeat patterns at the time the test is performed. You may also have other tests, such as:  Transthoracic echocardiogram (TTE). During echocardiography, sound waves are used to create a picture of all of the heart structures and to look at how blood flows through your heart.  Transesophageal echocardiogram (TEE).This is a more advanced imaging test that obtains images from inside your body. It allows your health care provider to see your heart in finer detail.  Cardiac monitoring. This allows your health care provider to monitor your heart rate and rhythm in real time.  Holter monitor. This is a portable  device that records your heartbeat and can help to diagnose abnormal heartbeats. It allows your health care provider to track your heart activity for several days, if needed.  Stress tests. These can be done through exercise or by taking medicine that makes your heart beat more quickly.  Blood tests.  Imaging tests. TREATMENT  Your treatment depends on what is causing your chest pain. Treatment may include:  Medicines. These may include:  Acid blockers for heartburn.  Anti-inflammatory medicine.  Pain medicine for inflammatory conditions.  Antibiotic medicine, if an infection is present.  Medicines to dissolve blood clots.  Medicines to treat coronary artery disease.  Supportive care for conditions that do not require medicines. This may  include:  Resting.  Applying heat or cold packs to injured areas.  Limiting activities until pain decreases. HOME CARE INSTRUCTIONS  If you were prescribed an antibiotic medicine, finish it all even if you start to feel better.  Avoid any activities that bring on chest pain.  Do not use any tobacco products, including cigarettes, chewing tobacco, or electronic cigarettes. If you need help quitting, ask your health care provider.  Do not drink alcohol.  Take medicines only as directed by your health care provider.  Keep all follow-up visits as directed by your health care provider. This is important. This includes any further testing if your chest pain does not go away.  If heartburn is the cause for your chest pain, you may be told to keep your head raised (elevated) while sleeping. This reduces the chance that acid will go from your stomach into your esophagus.  Make lifestyle changes as directed by your health care provider. These may include:  Getting regular exercise. Ask your health care provider to suggest some activities that are safe for you.  Eating a heart-healthy diet. A registered dietitian can help you to learn healthy eating options.  Maintaining a healthy weight.  Managing diabetes, if necessary.  Reducing stress. SEEK MEDICAL CARE IF:  Your chest pain does not go away after treatment.  You have a rash with blisters on your chest.  You have a fever. SEEK IMMEDIATE MEDICAL CARE IF:   Your chest pain is worse.  You have an increasing cough, or you cough up blood.  You have severe abdominal pain.  You have severe weakness.  You faint.  You have chills.  You have sudden, unexplained chest discomfort.  You have sudden, unexplained discomfort in your arms, back, neck, or jaw.  You have shortness of breath at any time.  You suddenly start to sweat, or your skin gets clammy.  You feel nauseous or you vomit.  You suddenly feel light-headed or  dizzy.  Your heart begins to beat quickly, or it feels like it is skipping beats. These symptoms may represent a serious problem that is an emergency. Do not wait to see if the symptoms will go away. Get medical help right away. Call your local emergency services (911 in the [place] Do not drive yourself to the hospital.   This information is not intended to replace advice given to you by your health care provider. Make sure you discuss any questions you have with your health care provider.   Document Released: 06/17/2005 Document Revised: 09/28/2014 Document Reviewed: 04/13/2014 Elsevier Interactive Patient Education 2016 [employer] Shortness of Breath Shortness of breath means you have trouble breathing. It could also mean that you have a medical problem. You should get immediate medical care for shortness of breath. CAUSES  Not enough oxygen in the air such as with high altitudes or a smoke-filled room.  Certain lung diseases, infections, or problems.  Heart disease or conditions, such as angina or heart failure.  Low red blood cells (anemia).  Poor physical fitness, which can cause shortness of breath when you exercise.  Chest or back injuries or stiffness.  Being overweight.  Smoking.  Anxiety, which can make you feel like you are not getting enough air. DIAGNOSIS  Serious medical problems can often be found during your physical exam. Tests may also be done to determine why you are having shortness of breath. Tests may include:  Chest X-rays.  Lung function tests.  Blood tests.  An electrocardiogram (ECG).  An ambulatory electrocardiogram. An ambulatory ECG records your heartbeat patterns over a 24-hour period.  Exercise testing.  A transthoracic echocardiogram (TTE). During echocardiography, sound waves are used to evaluate how blood flows through your heart.  A transesophageal echocardiogram (TEE).  Imaging scans. Your health care provider may not be able  to find a cause for your shortness of breath after your exam. In this case, it is important to have a follow-up exam with your health care provider as directed.  TREATMENT  Treatment for shortness of breath depends on the cause of your symptoms and can vary greatly. HOME CARE INSTRUCTIONS   Do not smoke. Smoking is a common cause of shortness of breath. If you smoke, ask for help to quit.  Avoid being around chemicals or things that may bother your breathing, such as paint fumes and dust.  Rest as needed. Slowly resume your usual activities.  If medicines were prescribed, take them as directed for the full length of time directed. This includes oxygen and any inhaled medicines.  Keep all follow-up appointments as directed by your health care provider. SEEK MEDICAL CARE IF:   Your condition does not improve in the time expected.  You have a hard time doing your normal activities even with rest.  You have any new symptoms. SEEK IMMEDIATE MEDICAL CARE IF:   Your shortness of breath gets worse.  You feel light-headed, faint, or develop a cough not controlled with medicines.  You start coughing up blood.  You have pain with breathing.  You have chest pain or pain in your arms, shoulders, or abdomen.  You have a fever.  You are unable to walk up stairs or exercise the way you normally do. MAKE SURE YOU:  Understand these instructions.  Will watch your condition.  Will get help right away if you are not doing well or get worse.   This information is not intended to replace advice given to you by your health care provider. Make sure you discuss any questions you have with your health care provider.   Document Released: 06/02/2001 Document Revised: 09/12/2013 Document Reviewed: 11/23/2011 Elsevier Interactive Patient Education [employer]

## 2016-01-06 NOTE — ED Notes (Signed)
Pt with chest pain and dizziness x 2 days. Better when laying down - increases when up. Was seen here previously for same - unspecified CP.

## 2016-01-06 NOTE — ED Notes (Signed)
AAOx3.  Skin warm and dry.  No SOB/ DOE.  NAD 

## 2016-01-06 NOTE — ED Provider Notes (Signed)
[facility] Emergency Department Provider Note   ____________________________________________  Time seen: Approximately 11:50 AM I have reviewed the triage vital signs and the triage nursing note.  HISTORY  Chief Complaint Chest Pain and Dizziness   Historian Patient  HPI Stanley Leach is a 45 y.o. male who is a smoker, but does not report other prior medical history. He previously followed by primary care physician, but currently does not have a primary care physician. He is here for chief complaint of dizziness, he says that then little bit intermittent over a week or so, but was most significant Saturday and Sunday. 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 No room spinning. No headache. No confusion altered mental status. No focal weakness or numbness. He states that he is having some problems with sinus congestion and seasonal allergies. He has had some episodes for the last couple of months of chest discomfort. Yesterday he described a pressure in his lower chest just below the sternum that went away on its own. He has had a history of GERD in the past, and is off of Protonix now, but does take over-the-counter PPI as needed.  He has also had some soreness over the left side of his chest wall intermittently. These chest discomfort symptoms are similar to when the patient was seen in the ED over the winter. He did not follow up with a cardiologist.  He reports some occasional shortness of breath. No coughing or fevers. No sinus drainage. He states he notices it the most right before is about to fall asleep in the weeks up taking and asked. No pleuritic chest pain. No palpitations.   Denies any illicit or recreational drug abuse. Denies any daily medications.  History reviewed. No pertinent past medical history.  There are no active  problems to display for this patient.   History reviewed. No pertinent past surgical history.  Current Outpatient Rx  Name  Route  Sig  Dispense  Refill  . HYDROcodone-acetaminophen (NORCO) 5-325 MG tablet   Oral   Take 1 tablet by mouth every 4 (four) hours as needed for moderate pain.   20 tablet   0   . ibuprofen (ADVIL,MOTRIN) 800 MG tablet   Oral   Take 1 tablet (800 mg total) by mouth every 8 (eight) hours as needed.   15 tablet   0   . LORazepam (ATIVAN) 1 MG tablet   Oral   Take 1 tablet (1 mg total) by mouth 2 (two) times daily.   20 tablet   0     Allergies Review of patient's allergies indicates no known allergies.  History reviewed. No pertinent family history.  Social History Social History  Substance Use Topics  . Smoking status: Heavy Tobacco Smoker -- 0.50 packs/day  . Smokeless tobacco: None  . Alcohol Use: Yes    Review of Systems  Constitutional: Negative for fever. Eyes: Negative for visual changes. ENT: Negative for sore throat. Cardiovascular: Chest discomfort as described per history of present illness. Respiratory: Currently no shortness of breath, but some episodes of shortness of breath as per history of present illness. Gastrointestinal: Negative for abdominal pain, vomiting and diarrhea. Genitourinary: Negative for dysuria. Musculoskeletal: Negative for back pain. Skin: Negative for rash. Neurological: Negative for headache. 10 point Review of Systems otherwise negative ____________________________________________   PHYSICAL EXAM:  VITAL SIGNS:  ED Triage Vitals  Enc Vitals Group     BP 01/06/16 0944 123/88 mmHg     Pulse Rate 01/06/16 0944 66     Resp --      Temp 01/06/16 0944 98.5 F (36.9 C)     Temp Source 01/06/16 0944 Oral     SpO2 01/06/16 0944 99 %     Weight 01/06/16 0944 250 lb (113.399 kg)     Height 01/06/16 0944  (1.854 m)     Head Cir --      Peak Flow --      Pain Score 01/06/16 0945 6     Pain  Loc --      Pain Edu? --      Excl. in GC? --      Constitutional: Alert and oriented. Well appearing and in no distress. HEENT   Head: Normocephalic and atraumatic.      Eyes: Conjunctivae are normal. PERRL. Normal extraocular movements.      Ears:         Nose: No congestion/rhinnorhea.   Mouth/Throat: Mucous membranes are moist.   Neck: No stridor. Cardiovascular/Chest: Normal rate, regular rhythm.  No murmurs, rubs, or gallops. Respiratory: Normal respiratory effort without tachypnea nor retractions. Breath sounds are clear and equal bilaterally. No wheezes/rales/rhonchi. Gastrointestinal: Soft. No distention, no guarding, no rebound. Nontender.    Genitourinary/rectal:Deferred Musculoskeletal: Nontender with normal range of motion in all extremities. No joint effusions.  No lower extremity tenderness.  No edema. Neurologic:  Normal speech and language. No gross or focal neurologic deficits are appreciated. Skin:  Skin is warm, dry and intact.Some mild sunburn. Multiple tattoos. Psychiatric: Mood and affect are normal. Speech and behavior are normal. Patient exhibits appropriate insight and judgment.  ____________________________________________   EKG I, Governor Rooks, MD, the attending physician have personally viewed and interpreted all ECGs.  936:62 bpm. Normal sinus rhythm with sinus arrhythmia. Narrow QRS. Normal axis. Nonspecific T-wave flattening inferiorly  1133:51 bpm. Normal sinus rhythm. Narrow QRS. Normal axis. Isolated T-wave inversion in lead 3 ____________________________________________  LABS (pertinent positives/negatives)  Basic metabolic panel within normal limits CBC within normal limits Troponin less than 0.03 D-dimer 277  ____________________________________________  RADIOLOGY All Xrays were viewed by me. Imaging interpreted by Radiologist.  Chest two-view: Negative two-view chest  x-ray __________________________________________  PROCEDURES  Procedure(s) performed: None  Critical Care performed: None  ____________________________________________   ED COURSE / ASSESSMENT AND PLAN  Pertinent labs & imaging results that were available during my care of the patient were reviewed by me and considered in my medical decision making (see chart for details).   The patient's main complaint is dizziness. Symptomatically, I am most suspicious that the dizziness is coming from sinus congestion due to seasonal pollen.  No symptoms of infection. His white blood cell count is normal. There's been no fever. No neurologic symptoms including no headache, weakness or numbness or confusion altered mental status. No headache. I'm not concerned for intracranial emergency including not limited to stroke, mass, or bleed. I discussed this with the patient that at this point I don't necessarily recommend a head CT for imaging given versed versus benefit. He is in agreement to treat presumptively as dizziness due to sinus congestion.  I also considered possibility of dehydration given that he didn't eat much and he worked a very long day on Saturday. He has no electrolyte abnormalities, nor acute renal failure.  I have encouraged him to stay hydrated.  In terms of  the chest discomfort, his EKG is reassuring with no ischemic findings. His troponin is negative. These symptoms have been ongoing and intermittent for several months. I spoke with Dr. Vennie Homansalderwood regarding his nonspecific chest discomfort, as well as sinus bradycardia to 44 and up to 77 bpm here in the emergency department which is asymptomatic, and the patient will be seen tomorrow at 2:30 and Dr. Glennis Brinkallwood's office.  In terms of the intermittent shortness of breath, clinically it sounds most like sleep apnea, however given the patient's nonspecific complaints without a cause including some chest discomfort, dizziness, and intermittent  shortness breath, I am going to add on a d-dimer.  I think this would be a very atypical presentation for PE, with no pleuritic chest discomfort, no hypoxia, no lower extremity swelling or calf tenderness.  D-dimer negative. I will go ahead and discharge the patient with PO2 file, and recommended to follow with a primary care physician as well.   CONSULTATIONS:  Phone discussion with Dr. Juliann Paresallwood, cardiology.   Patient / Family / Caregiver informed of clinical course, medical decision-making process, and agree with plan.   I discussed return precautions, follow-up instructions, and discharged instructions with patient and/or family.   ___________________________________________   FINAL CLINICAL IMPRESSION(S) / ED DIAGNOSES   Final diagnoses:  Nonspecific chest pain  Dizziness              Note: This dictation was prepared with Dragon dictation. Any transcriptional errors that result from this process are unintentional   Governor Rooksebecca Lashante Fryberger, MD 01/06/16 1252

## 2016-01-06 NOTE — ED Notes (Signed)
Pt to ED today for intermittent dizziness, with fatigue and diaphoresis in the last couple of days. Pt does endorse having chest pain yesterday that did not last per pt report.  Pt states that the dizziness intermittently gets better/worse, but that it does not completely subside.  Upon arrival to ED room 6, pt HR in 50's, sometimes dipping to 48, sinus bradycardia.

## 2016-01-21 ENCOUNTER — Encounter: Payer: Self-pay | Admitting: Emergency Medicine

## 2016-01-21 ENCOUNTER — Emergency Department
Admission: EM | Admit: 2016-01-21 | Discharge: 2016-01-21 | Disposition: A | Discharge status: Home / Self Care | Payer: Self-pay | Attending: Emergency Medicine | Admitting: Emergency Medicine

## 2016-01-21 DIAGNOSIS — F17200 Nicotine dependence, unspecified, uncomplicated: Secondary | ICD-10-CM | POA: Insufficient documentation

## 2016-01-21 DIAGNOSIS — M545 Low back pain, unspecified: Secondary | ICD-10-CM

## 2016-01-21 LAB — URINALYSIS COMPLETE WITH MICROSCOPIC (ARMC ONLY)
BACTERIA UA: NONE SEEN
BILIRUBIN URINE: NEGATIVE
Glucose, UA: NEGATIVE mg/dL
Hgb urine dipstick: NEGATIVE
KETONES UR: NEGATIVE mg/dL
Leukocytes, UA: NEGATIVE
NITRITE: NEGATIVE
PROTEIN: NEGATIVE mg/dL
Specific Gravity, Urine: 1.023 (ref 1.005–1.030)
pH: 5 (ref 5.0–8.0)

## 2016-01-21 MED ORDER — BACLOFEN 10 MG PO TABS: 10.0000 mg | ORAL_TABLET | Freq: Three times a day (TID) | ORAL | Status: DC

## 2016-01-21 MED ORDER — NAPROXEN 500 MG PO TABS: 500.0000 mg | ORAL_TABLET | Freq: Two times a day (BID) | ORAL | Status: DC

## 2016-01-21 NOTE — ED Provider Notes (Signed)
[facility] Emergency Department Provider Note  ____________________________________________  Time seen: Approximately 10:59 AM  I have reviewed the triage vital signs and the nursing notes.   HISTORY  Chief Complaint Back Pain    HPI Stanley Leach is a 45 y.o. male complains of right flank pain 3 days. Denies any injury no lifting turning straining. He reports pain as a 1/10. Denies any urinary frequency, hesitancy or dysuria. Denies any blood in urine.   History reviewed. No pertinent past medical history.  There are no active problems to display for this patient.   History reviewed. No pertinent past surgical history.  Current Outpatient Rx  Name  Route  Sig  Dispense  Refill  . baclofen (LIORESAL) 10 MG tablet   Oral   Take 1 tablet (10 mg total) by mouth 3 (three) times daily.   30 tablet   0   . naproxen (NAPROSYN) 500 MG tablet   Oral   Take 1 tablet (500 mg total) by mouth 2 (two) times daily with a meal.   60 tablet   0     Allergies Review of patient's allergies indicates no known allergies.  History reviewed. No pertinent family history.  Social History Social History  Substance Use Topics  . Smoking status: Heavy Tobacco Smoker -- 0.50 packs/day  . Smokeless tobacco: None  . Alcohol Use: Yes    Review of Systems Constitutional: No fever/chills Eyes: No visual changes. ENT: No sore throat. Cardiovascular: Denies chest pain. Respiratory: Denies shortness of breath. Gastrointestinal: No abdominal pain.  No nausea, no vomiting.  No diarrhea.  No constipation. Genitourinary: Negative for dysuria. Musculoskeletal: Positive for right flank pain. Skin: Negative for rash. Neurological: Negative for headaches, focal weakness or numbness.  10-point ROS otherwise negative.  ____________________________________________   PHYSICAL EXAM:  VITAL SIGNS: ED Triage Vitals  Enc Vitals Group     BP 01/21/16 1033  134/88 mmHg     Pulse Rate 01/21/16 1033 69     Resp 01/21/16 1033 18     Temp 01/21/16 1033 98.1 F (36.7 C)     Temp Source 01/21/16 1033 Oral     SpO2 01/21/16 1033 98 %     Weight 01/21/16 1033 260 lb (117.935 kg)     Height 01/21/16 1033 6\' 1"  (1.854 m)     Head Cir --      Peak Flow --      Pain Score 01/21/16 1034 2     Pain Loc --      Pain Edu? --      Excl. in GC? --     Constitutional: Alert and oriented. Well appearing and in no acute distress.  Cardiovascular: Normal rate, regular rhythm. Grossly normal heart sounds.  Good peripheral circulation. Respiratory: Normal respiratory effort.  No retractions. Lungs CTAB. Gastrointestinal: Soft and nontender. No distention. No CVA tenderness. Musculoskeletal: No lower extremity tenderness nor edema.  No joint effusions. Neurologic:  Normal speech and language. No gross focal neurologic deficits are appreciated. No gait instability. Skin:  Skin is warm, dry and intact. No rash noted. Psychiatric: Mood and affect are normal. Speech and behavior are normal.  ____________________________________________   LABS (all labs ordered are listed, but only abnormal results are displayed)  Labs Reviewed  URINALYSIS COMPLETEWITH MICROSCOPIC ([facility] ONLY) - Abnormal; Notable for the following:    Color, Urine YELLOW (*)    APPearance CLEAR (*)    Squamous Epithelial / LPF 0-5 (*)  All other components within normal limits   ____________________________________________  RADIOLOGY   ____________________________________________   PROCEDURES  Procedure(s) performed: None  Critical Care performed: No  ____________________________________________   INITIAL IMPRESSION / ASSESSMENT AND PLAN / ED COURSE  Pertinent labs & imaging results that were available during my care of the patient were reviewed by me and considered in my medical decision making (see chart for details).  Acute lumbar myofascial strain. Rx given for  baclofen 10 mg 3 times a day and Naprosyn 500 mg twice a day. Patient to follow up with PCP or return to the ER with any worsening symptomology. Encouraged heat therapy to relax muscles. Patient voices no other emergency medical complaints at this time ____________________________________________   FINAL CLINICAL IMPRESSION(S) / ED DIAGNOSES  Final diagnoses:  Right-sided low back pain without sciatica     This chart was dictated using voice recognition software/Dragon. Despite best efforts to proofread, errors can occur which can change the meaning. Any change was purely unintentional.   Evangeline Dakin, PA-C 01/21/16 1157  Jeanmarie Plant, MD 01/21/16 [phone]

## 2016-01-21 NOTE — Discharge Instructions (Signed)
Back Pain, Adult °Back pain is very common in adults. The cause of back pain is rarely dangerous and the pain often gets better over time. The cause of your back pain may not be known. Some common causes of back pain include: °· Strain of the muscles or ligaments supporting the spine. °· Wear and tear (degeneration) of the spinal disks. °· Arthritis. °· Direct injury to the back. °For many people, back pain may return. Since back pain is rarely dangerous, most people can learn to manage this condition on their own. °HOME CARE INSTRUCTIONS °Watch your back pain for any changes. The following actions may help to lessen any discomfort you are feeling: °· Remain active. It is stressful on your back to sit or stand in one place for long periods of time. Do not sit, drive, or stand in one place for more than 30 minutes at a time. Take short walks on even surfaces as soon as you are able. Try to increase the length of time you walk each day. °· Exercise regularly as directed by your health care provider. Exercise helps your back heal faster. It also helps avoid future injury by keeping your muscles strong and flexible. °· Do not stay in bed. Resting more than 1-2 days can delay your recovery. °· Pay attention to your body when you bend and lift. The most comfortable positions are those that put less stress on your recovering back. Always use proper lifting techniques, including: °· Bending your knees. °· Keeping the load close to your body. °· Avoiding twisting. °· Find a comfortable position to sleep. Use a firm mattress and lie on your side with your knees slightly bent. If you lie on your back, put a pillow under your knees. °· Avoid feeling anxious or stressed. Stress increases muscle tension and can worsen back pain. It is important to recognize when you are anxious or stressed and learn ways to manage it, such as with exercise. °· Take medicines only as directed by your health care provider. Over-the-counter  medicines to reduce pain and inflammation are often the most helpful. Your health care provider may prescribe muscle relaxant drugs. These medicines help dull your pain so you can more quickly return to your normal activities and healthy exercise. °· Apply ice to the injured area: °· Put ice in a plastic bag. °· Place a towel between your skin and the bag. °· Leave the ice on for 20 minutes, 2-3 times a day for the first 2-3 days. After that, ice and heat may be alternated to reduce pain and spasms. °· Maintain a healthy weight. Excess weight puts extra stress on your back and makes it difficult to maintain good posture. °SEEK MEDICAL CARE IF: °· You have pain that is not relieved with rest or medicine. °· You have increasing pain going down into the legs or buttocks. °· You have pain that does not improve in one week. °· You have night pain. °· You lose weight. °· You have a fever or chills. °SEEK IMMEDIATE MEDICAL CARE IF:  °· You develop new bowel or bladder control problems. °· You have unusual weakness or numbness in your arms or legs. °· You develop nausea or vomiting. °· You develop abdominal pain. °· You feel faint. °  °This information is not intended to replace advice given to you by your health care provider. Make sure you discuss any questions you have with your health care provider. °  °Document Released: 09/07/2005 Document Revised: 09/28/2014 Document Reviewed: 01/09/2014 °Elsevier Interactive Patient Education ©2016 [employer] ° °Heat Therapy °Heat therapy can help ease sore, stiff, injured, and tight muscles and joints. Heat relaxes your muscles, which may help ease your pain.  °RISKS AND COMPLICATIONS °If you have any of the following conditions, do not use heat therapy unless your health care provider has approved: °· Poor circulation. °· Healing wounds or scarred skin in the area being treated. °· Diabetes, heart disease, or high blood pressure. °· Not being able to feel (numbness) the area  being treated. °· Unusual swelling of the area being treated. °· Active infections. °· Blood clots. °· Cancer. °· Inability to communicate pain. This may include young children and people who have problems with their brain function (dementia). °· Pregnancy. °Heat therapy should only be used on old, pre-existing, or long-lasting (chronic) injuries. Do not use heat therapy on new injuries unless directed by your health care provider. °HOW TO USE HEAT THERAPY °There are several different kinds of heat therapy, including: °· Moist heat pack. °· Warm water bath. °· Hot water bottle. °· Electric heating pad. °· Heated gel pack. °· Heated wrap. °· Electric heating pad. °Use the heat therapy method suggested by your health care provider. Follow your health care provider's instructions on when and how to use heat therapy. °GENERAL HEAT THERAPY RECOMMENDATIONS °· Do not sleep while using heat therapy. Only use heat therapy while you are awake. °· Your skin may turn pink while using heat therapy. Do not use heat therapy if your skin turns red. °· Do not use heat therapy if you have new pain. °· High heat or long exposure to heat can cause burns. Be careful when using heat therapy to avoid burning your skin. °· Do not use heat therapy on areas of your skin that are already irritated, such as with a rash or sunburn. °SEEK MEDICAL CARE IF: °· You have blisters, redness, swelling, or numbness. °· You have new pain. °· Your pain is worse. °MAKE SURE YOU: °· Understand these instructions. °· Will watch your condition. °· Will get help right away if you are not doing well or get worse. °  °This information is not intended to replace advice given to you by your health care provider. Make sure you discuss any questions you have with your health care provider. °  °Document Released: 11/30/2011 Document Revised: 09/28/2014 Document Reviewed: 10/31/2013 °Elsevier Interactive Patient Education ©2016 [employer]vier Inc. ° °Musculoskeletal  Pain °Musculoskeletal pain is muscle and boney aches and pains. These pains can occur in any part of the body. Your caregiver may treat you without knowing the cause of the pain. They may treat you if blood or urine tests, X-rays, and other tests were normal.  °CAUSES °There is often not a definite cause or reason for these pains. These pains may be caused by a type of germ (virus). The discomfort may also come from overuse. Overuse includes working out too hard when your body is not fit. Boney aches also come from weather changes. Bone is sensitive to atmospheric pressure changes. °HOME CARE INSTRUCTIONS  °· Ask when your test results will be ready. Make sure you get your test results. °· Only take over-the-counter or prescription medicines for pain, discomfort, or fever as directed by your caregiver. If you were given medications for your condition, do not drive, operate machinery or power tools, or sign legal documents for 24 hours. Do not drink alcohol. Do not take sleeping pills or other medications that may interfere with treatment. °· Continue all activities unless   the activities cause more pain. When the pain lessens, slowly resume normal activities. Gradually increase the intensity and duration of the activities or exercise. °· During periods of severe pain, bed rest may be helpful. Lay or sit in any position that is comfortable. °· Putting ice on the injured area. °¨ Put ice in a bag. °¨ Place a towel between your skin and the bag. °¨ Leave the ice on for 15 to 20 minutes, 3 to 4 times a day. °· Follow up with your caregiver for continued problems and no reason can be found for the pain. If the pain becomes worse or does not go away, it may be necessary to repeat tests or do additional testing. Your caregiver may need to look further for a possible cause. °SEEK IMMEDIATE MEDICAL CARE IF: °· You have pain that is getting worse and is not relieved by medications. °· You develop chest pain that is associated  with shortness or breath, sweating, feeling sick to your stomach (nauseous), or throw up (vomit). °· Your pain becomes localized to the abdomen. °· You develop any new symptoms that seem different or that concern you. °MAKE SURE YOU:  °· Understand these instructions. °· Will watch your condition. °· Will get help right away if you are not doing well or get worse. °  °This information is not intended to replace advice given to you by your health care provider. Make sure you discuss any questions you have with your health care provider. °  °Document Released: 09/07/2005 Document Revised: 11/30/2011 Document Reviewed: 05/12/2013 °Elsevier Interactive Patient Education ©2016 Elsevier Inc. ° °

## 2016-01-21 NOTE — ED Notes (Signed)
Patient denies pain and is resting comfortably.  

## 2016-01-21 NOTE — ED Notes (Addendum)
Pt to ed with c/o right flank pain since Friday.  Pt states pain is intermittent and occasional.  Pt states now pain 1/10,  However when he stands up occasionally he has a sharp pain to right flank area.

## 2016-01-21 NOTE — ED Notes (Signed)
States he developed right lower back pain   Pain is mainly at flank area for several days  No injury or urinary sx's

## 2016-10-16 ENCOUNTER — Emergency Department
Admission: EM | Admit: 2016-10-16 | Discharge: 2016-10-16 | Disposition: A | Discharge status: Home / Self Care | Payer: Self-pay | Attending: Emergency Medicine | Admitting: Emergency Medicine

## 2016-10-16 ENCOUNTER — Emergency Department: Payer: Self-pay

## 2016-10-16 DIAGNOSIS — R55 Syncope and collapse: Secondary | ICD-10-CM | POA: Insufficient documentation

## 2016-10-16 DIAGNOSIS — R42 Dizziness and giddiness: Secondary | ICD-10-CM | POA: Insufficient documentation

## 2016-10-16 DIAGNOSIS — F17200 Nicotine dependence, unspecified, uncomplicated: Secondary | ICD-10-CM | POA: Insufficient documentation

## 2016-10-16 DIAGNOSIS — R350 Frequency of micturition: Secondary | ICD-10-CM | POA: Insufficient documentation

## 2016-10-16 DIAGNOSIS — R631 Polydipsia: Secondary | ICD-10-CM | POA: Insufficient documentation

## 2016-10-16 LAB — CBC
HEMATOCRIT: 45.6 % (ref 40.0–52.0)
Hemoglobin: 15.4 g/dL (ref 13.0–18.0)
MCH: 30.6 pg (ref 26.0–34.0)
MCHC: 33.7 g/dL (ref 32.0–36.0)
MCV: 90.7 fL (ref 80.0–100.0)
Platelets: 313 10*3/uL (ref 150–440)
RBC: 5.03 MIL/uL (ref 4.40–5.90)
RDW: 13.2 % (ref 11.5–14.5)
WBC: 6.9 10*3/uL (ref 3.8–10.6)

## 2016-10-16 LAB — BASIC METABOLIC PANEL
Anion gap: 9 (ref 5–15)
BUN: 10 mg/dL (ref 6–20)
CHLORIDE: 101 mmol/L (ref 101–111)
CO2: 26 mmol/L (ref 22–32)
Calcium: 8.8 mg/dL — ABNORMAL LOW (ref 8.9–10.3)
Creatinine, Ser: 0.81 mg/dL (ref 0.61–1.24)
GFR calc Af Amer: >60 mL/min (ref >60)
GFR calc non Af Amer: >60 mL/min (ref >60)
GLUCOSE: 109 mg/dL — AB (ref 65–99)
POTASSIUM: 3.6 mmol/L (ref 3.5–5.1)
Sodium: 136 mmol/L (ref 135–145)

## 2016-10-16 LAB — URINALYSIS, COMPLETE (UACMP) WITH MICROSCOPIC
BACTERIA UA: NONE SEEN
BILIRUBIN URINE: NEGATIVE
Glucose, UA: NEGATIVE mg/dL
Hgb urine dipstick: NEGATIVE
Ketones, ur: NEGATIVE mg/dL
Leukocytes, UA: NEGATIVE
Nitrite: NEGATIVE
PROTEIN: NEGATIVE mg/dL
SQUAMOUS EPITHELIAL / LPF: NONE SEEN
Specific Gravity, Urine: 1.014 (ref 1.005–1.030)
WBC, UA: NONE SEEN WBC/hpf (ref 0–5)
pH: 6 (ref 5.0–8.0)

## 2016-10-16 LAB — GLUCOSE, CAPILLARY: GLUCOSE-CAPILLARY: 110 mg/dL — AB (ref 65–99)

## 2016-10-16 LAB — FIBRIN DERIVATIVES D-DIMER (ARMC ONLY): FIBRIN DERIVATIVES D-DIMER (ARMC): 264 (ref 0–499)

## 2016-10-16 MED ORDER — SODIUM CHLORIDE 0.9 % IV BOLUS (SEPSIS)
1000.0000 mL | Freq: Once | INTRAVENOUS | Status: AC
Start: 2016-10-16 — End: 2016-10-16
  Administered 2016-10-16: 1000 mL via INTRAVENOUS

## 2016-10-16 NOTE — Discharge Instructions (Signed)
Please drink plenty fluids and advance her diet as tolerated. Return here for bloody stool, fever, chest pain, shortness of breath, loss of consciousness or any other new concerns  Please return immediately if condition worsens. Please contact her primary physician or the physician you were given for referral. If you have any specialist physicians involved in her treatment and plan please also contact them. Thank you for using [facility].

## 2016-10-16 NOTE — ED Notes (Signed)
Pt states increase in thirst and urination recently. Denies any known family hx of diabetes.

## 2016-10-16 NOTE — ED Provider Notes (Signed)
Time Seen: Approximately 1628  I have reviewed the triage notes  Chief Complaint: Dizziness   History of Present Illness: Stanley Leach is a 46 y.o. male who presents with some generalized viral type symptoms with low-grade fever cough nasal congestion. He states he seems to recovered from that but today developed some feelings of shortness of breath and feelings of lightheadedness. He denies any pleuritic pain or productive nature to his cough. He denies any current nausea, vomiting or diarrhea. Denies any melena or hematochezia. She denies any significant headache or neck stiffness or rashes   History reviewed. No pertinent past medical history.  There are no active problems to display for this patient.   History reviewed. No pertinent surgical history.  History reviewed. No pertinent surgical history.  Current Outpatient Rx  . Order #: [id number] Class: Print  . Order #: [id number] Class: Print    Allergies:  Patient has no known allergies.  Family History: History reviewed. No pertinent family history.  Social History: Social History  Substance Use Topics  . Smoking status: Heavy Tobacco Smoker    Packs/day: 0.50  . Smokeless tobacco: Not on file  . Alcohol use Yes     Review of Systems:   10 point review of systems was performed and was otherwise negative:  Constitutional: No fever Eyes: No visual disturbances ENT: No sore throat, ear pain Cardiac: No chest pain Respiratory: No shortness of breath, wheezing, or stridor Abdomen: No abdominal pain, no vomiting, No diarrhea Endocrine: No weight loss, No night sweats Extremities: No peripheral edema, cyanosis Skin: No rashes, easy bruising Neurologic: No focal weakness, trouble with speech or swollowing Urologic: He describes some urinary frequency and some generalized thirst   Physical Exam:  ED Triage Vitals  Enc Vitals Group     BP 10/16/16 1417 128/88     Pulse Rate 10/16/16 1417 77   Resp 10/16/16 1417 18     Temp 10/16/16 1417 98.3 F (36.8 C)     Temp Source 10/16/16 1417 Oral     SpO2 10/16/16 1417 97 %     Weight 10/16/16 1418 265 lb (120.2 kg)     Height 10/16/16 1418 6\' 2"  (1.88 m)     Head Circumference --      Peak Flow --      Pain Score 10/16/16 1418 0     Pain Loc --      Pain Edu? --      Excl. in GC? --     General: Awake , Alert , and Oriented times 3; GCS 15 Head: Normal cephalic , atraumatic Eyes: Pupils equal , round, reactive to light Nose/Throat: No nasal drainage, patent upper airway without erythema or exudate.  Neck: Supple, Full range of motion, No anterior adenopathy or palpable thyroid masses Lungs: Clear to ascultation without wheezes , rhonchi, or rales Heart: Regular rate, regular rhythm without murmurs , gallops , or rubs Abdomen: Soft, non tender without rebound, guarding , or rigidity; bowel sounds positive and symmetric in all 4 quadrants. No organomegaly .        Extremities: 2 plus symmetric pulses. No edema, clubbing or cyanosis Neurologic: normal ambulation, Motor symmetric without deficits, sensory intact Skin: warm, dry, no rashes   Labs:   All laboratory work was reviewed including any pertinent negatives or positives listed below:  Labs Reviewed  BASIC METABOLIC PANEL - Abnormal; Notable for the following:       Result Value   Glucose, Bld 109 (*)  Calcium 8.8 (*)    All other components within normal limits  URINALYSIS, COMPLETE (UACMP) WITH MICROSCOPIC - Abnormal; Notable for the following:    Color, Urine YELLOW (*)    APPearance CLEAR (*)    All other components within normal limits  GLUCOSE, CAPILLARY - Abnormal; Notable for the following:    Glucose-Capillary 110 (*)    All other components within normal limits  CBC  FIBRIN DERIVATIVES D-DIMER ([facility] ONLY)  CBG MONITORING, ED  Laboratory work overall is within normal limits  EKG:  ED ECG REPORT I, Jennye Moccasin, the attending physician, personally  viewed and interpreted this ECG.  Date: 10/16/2016 EKG Time: 1423 Rate: 63 Rhythm: normal sinus rhythm with sinus arrhythmia QRS Axis: normal Intervals: normal ST/T Wave abnormalities: normal Conduction Disturbances: none Narrative Interpretation: unremarkable No ischemic changes  Radiology:  "Dg Chest 2 View  Result Date: 10/16/2016 CLINICAL DATA:  Dyspnea EXAM: CHEST  2 VIEW COMPARISON:  01/06/2016 FINDINGS: The heart size and mediastinal contours are within normal limits. Both lungs are clear. The visualized skeletal structures are unremarkable. IMPRESSION: No active cardiopulmonary disease. Electronically Signed   By: Tollie Eth M.D.   On: 10/16/2016 18:57  "  I personally reviewed the radiologic studies   ED Course:  Patient's dizziness is described as near syncope. He denies any vertiginous component to his complaints. Patient was given IV fluids here with the same diagnosis of dehydration. He is otherwise hemodynamically stable and given a negative D-dimer test and felt we did not need to pursue fever or cardiovascular or pulmonary emboli causes for his near syncope. The patient was evaluated for similar issues back in April of this past year. He was advised to follow up with his primary physician and/or specialist that he saw an April and return here if he passes out, develops chest pain, develops persistent shortness of breath or any other new concerns     Assessment:  Near syncope  Final Clinical Impression: * Final diagnoses:  Dizziness  Near syncope     Plan:  Outpatient Patient was advised to return immediately if condition worsens. Patient was advised to follow up with their primary care physician or other specialized physicians involved in their outpatient care. The patient and/or family member/power of attorney had laboratory results reviewed at the bedside. All questions and concerns were addressed and appropriate discharge instructions were distributed by  the nursing staff.              Jennye Moccasin, MD 10/16/16 [phone]

## 2016-10-16 NOTE — ED Triage Notes (Signed)
Pt states hasn't been feeling well since Monday, congestion, cough. Denies N&V, diarrhea, fever. States today began to feel dizzy and "can't get a good breath in." Denies pain with deep breath.

## 2016-12-24 ENCOUNTER — Encounter: Payer: Self-pay | Admitting: Emergency Medicine

## 2016-12-24 ENCOUNTER — Emergency Department
Admission: EM | Admit: 2016-12-24 | Discharge: 2016-12-24 | Disposition: A | Discharge status: Home / Self Care | Payer: Self-pay | Attending: Emergency Medicine | Admitting: Emergency Medicine

## 2016-12-24 DIAGNOSIS — Y9389 Activity, other specified: Secondary | ICD-10-CM | POA: Insufficient documentation

## 2016-12-24 DIAGNOSIS — Y999 Unspecified external cause status: Secondary | ICD-10-CM | POA: Insufficient documentation

## 2016-12-24 DIAGNOSIS — Y929 Unspecified place or not applicable: Secondary | ICD-10-CM | POA: Insufficient documentation

## 2016-12-24 DIAGNOSIS — S39012A Strain of muscle, fascia and tendon of lower back, initial encounter: Secondary | ICD-10-CM

## 2016-12-24 DIAGNOSIS — F17200 Nicotine dependence, unspecified, uncomplicated: Secondary | ICD-10-CM | POA: Insufficient documentation

## 2016-12-24 DIAGNOSIS — X500XXA Overexertion from strenuous movement or load, initial encounter: Secondary | ICD-10-CM | POA: Insufficient documentation

## 2016-12-24 MED ORDER — NAPROXEN 500 MG PO TABS: 500.0000 mg | ORAL_TABLET | Freq: Two times a day (BID) | ORAL | 0 refills | Status: DC

## 2016-12-24 MED ORDER — KETOROLAC TROMETHAMINE 60 MG/2ML IM SOLN
INTRAMUSCULAR | Status: AC
  Filled 2016-12-24: qty 2

## 2016-12-24 MED ORDER — KETOROLAC TROMETHAMINE 30 MG/ML IJ SOLN
30.0000 mg | Freq: Once | INTRAMUSCULAR | Status: AC
  Administered 2016-12-24: 30 mg via INTRAMUSCULAR

## 2016-12-24 MED ORDER — DIAZEPAM 2 MG PO TABS: 2.0000 mg | ORAL_TABLET | Freq: Three times a day (TID) | ORAL | 0 refills | Status: DC | PRN

## 2016-12-24 MED ORDER — HYDROCODONE-ACETAMINOPHEN 5-325 MG PO TABS: 1.0000 | ORAL_TABLET | ORAL | 0 refills | Status: DC | PRN

## 2016-12-24 NOTE — ED Notes (Signed)
See triage note  States he bent over to pick up a case of beer  Felt pain to lower back  Pain is mainly on the left and radiates into left leg  Ambulates slowly to treatment room

## 2016-12-24 NOTE — ED Triage Notes (Signed)
Says lifted case of beer and felt pain in back.  Has had back pain in past.  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

## 2016-12-24 NOTE — ED Provider Notes (Signed)
[facility] Emergency Department Provider Note   ____________________________________________   First MD Initiated Contact with Patient 12/24/16 [phone]     (approximate)  I have reviewed the triage vital signs and the nursing notes.   HISTORY  Chief Complaint Back Pain    HPI Stanley Leach is a 46 y.o. male is here with complaint of left-sided back pain. 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 Patient states he has had problems with back pain in the past but no surgery has been required. He does not want to file this is workman's comp. Patient states that it is painful to walk or be and at this time. He is not taking any over-the-counter medication prior to his arrival in the emergency room. He denies any bowel or bladder incontinence but does describe paresthesias into his left lower extremity. Patient states that he has experienced sciatica in the past. Currently rates his pain as a 4 out of 10.   History reviewed. No pertinent past medical history.  There are no active problems to display for this patient.   History reviewed. No pertinent surgical history.  Prior to Admission medications   Medication Sig Start Date End Date Taking? Authorizing Provider  diazepam (VALIUM) 2 MG tablet Take 1 tablet (2 mg total) by mouth every 8 (eight) hours as needed for muscle spasms. 12/24/16   Tommi Rumps, PA-C  HYDROcodone-acetaminophen (NORCO/VICODIN) 5-325 MG tablet Take 1 tablet by mouth every 4 (four) hours as needed for moderate pain. 12/24/16   Tommi Rumps, PA-C  naproxen (NAPROSYN) 500 MG tablet Take 1 tablet (500 mg total) by mouth 2 (two) times daily with a meal. 12/24/16   Tommi Rumps, PA-C    Allergies Patient has no known allergies.  No family history on file.  Social History Social History  Substance Use Topics  . Smoking status:  Heavy Tobacco Smoker    Packs/day: 0.50  . Smokeless tobacco: Never Used  . Alcohol use Yes    Review of Systems Constitutional: No fever/chills Cardiovascular: Denies chest pain. Respiratory: Denies shortness of breath. Gastrointestinal: No abdominal pain.  No nausea, no vomiting.   Genitourinary: Negative for dysuria. Musculoskeletal: Positive for left-sided back pain. Skin: Negative for rash. Neurological: Negative for headaches, focal weakness or numbness. Positive for left lower extremity radiculopathy.  10-point ROS otherwise negative.  ____________________________________________   PHYSICAL EXAM:  VITAL SIGNS: ED Triage Vitals  Enc Vitals Group     BP 12/24/16 0841 (!) 155/96     Pulse Rate 12/24/16 0841 95     Resp 12/24/16 0841 20     Temp 12/24/16 0841 98.1 F (36.7 C)     Temp Source 12/24/16 0841 Oral     SpO2 12/24/16 0841 97 %     Weight 12/24/16 0842 250 lb (113.4 kg)     Height 12/24/16 0842  (1.854 m)     Head Circumference --      Peak Flow --      Pain Score 12/24/16 0837 4     Pain Loc --      Pain Edu? --      Excl. in GC? --     Constitutional: Alert and oriented. Well appearing and in no acute distress. Eyes: Conjunctivae are normal. PERRL. EOMI. Head: Atraumatic. Nose: No congestion/rhinnorhea. Neck: No stridor.   Cardiovascular:  Normal rate, regular rhythm. Grossly normal heart sounds.  Good peripheral circulation. Respiratory: Normal respiratory effort.  No retractions. Lungs CTAB. Gastrointestinal: Soft and nontender. No distention.  Musculoskeletal: Examination of the back there is no gross deformity noted. There is moderate tenderness on palpation of the paravertebral muscles to the left in the lumbar region. Range of motion is restricted secondary discomfort. Straight leg raise his right leg are negative. Left leg approximately 40. Good muscle strength bilaterally. No point tenderness on palpation of the vertebral  bodies. Neurologic:  Normal speech and language. No gross focal neurologic deficits are appreciated. Reflexes are 1+ bilaterally. No gait instability. Skin:  Skin is warm, dry and intact. No rash noted. Psychiatric: Mood and affect are normal. Speech and behavior are normal.  ____________________________________________   LABS (all labs ordered are listed, but only abnormal results are displayed)  Labs Reviewed - No data to display   PROCEDURES  Procedure(s) performed: None  Procedures  Critical Care performed: No  ____________________________________________   INITIAL IMPRESSION / ASSESSMENT AND PLAN / ED COURSE  Pertinent labs & imaging results that were available during my care of the patient were reviewed by me and considered in my medical decision making (see chart for details).  Patient was given Toradol 30 g IM in the department. Patient is driving therefore prescriptions were given to him. Patient given prescription for diazepam 2 mg 1 every 8 hours as needed for muscle spasms for the next 3 days Norco as needed for severe pain #15 and naproxen 500 mg twice a day with food. He is follow-up with [facility] for recheck of his blood pressure and also follow-up for his back pain. Patient was given a note to remain out of work until 4/8.      ____________________________________________   FINAL CLINICAL IMPRESSION(S) / ED DIAGNOSES  Final diagnoses:  Acute myofascial strain of lumbar region, initial encounter      NEW MEDICATIONS STARTED DURING THIS VISIT:  New Prescriptions   DIAZEPAM (VALIUM) 2 MG TABLET    Take 1 tablet (2 mg total) by mouth every 8 (eight) hours as needed for muscle spasms.   HYDROCODONE-ACETAMINOPHEN (NORCO/VICODIN) 5-325 MG TABLET    Take 1 tablet by mouth every 4 (four) hours as needed for moderate pain.   NAPROXEN (NAPROSYN) 500 MG TABLET    Take 1 tablet (500 mg total) by mouth 2 (two) times daily with a meal.     Note:  This  document was prepared using Dragon voice recognition software and may include unintentional dictation errors.    Tommi Rumps, PA-C 12/24/16 1020    Merrily Brittle, MD 12/24/16 [phone]

## 2016-12-24 NOTE — Discharge Instructions (Signed)
Follow-up with [facility] acute-care for recheck of your back and also for recheck of your blood pressure. Blood pressure today was slightly elevated at 155/96. Begin taking medication as directed. Diazepam 2 mg 1 tablet every 8 hours as needed for muscle spasms for the next 3 days. Norco every 4 hours as needed for moderate pain. These medications could cause drowsiness increase your  risk for falling and you should not drive. Naproxen 5 mg twice a day with food. This medication can be taken while driving.

## 2017-02-21 IMAGING — CR DG SHOULDER 2+V*L*
3 series · 3 of 3 positions shown · non-contrast
Comparison: Chest x-ray 09/23/2014

CLINICAL DATA: Chronic left shoulder pain 3 months.  No injury.

EXAM:
LEFT SHOULDER - 2+ VIEW

[shoulder grashey]
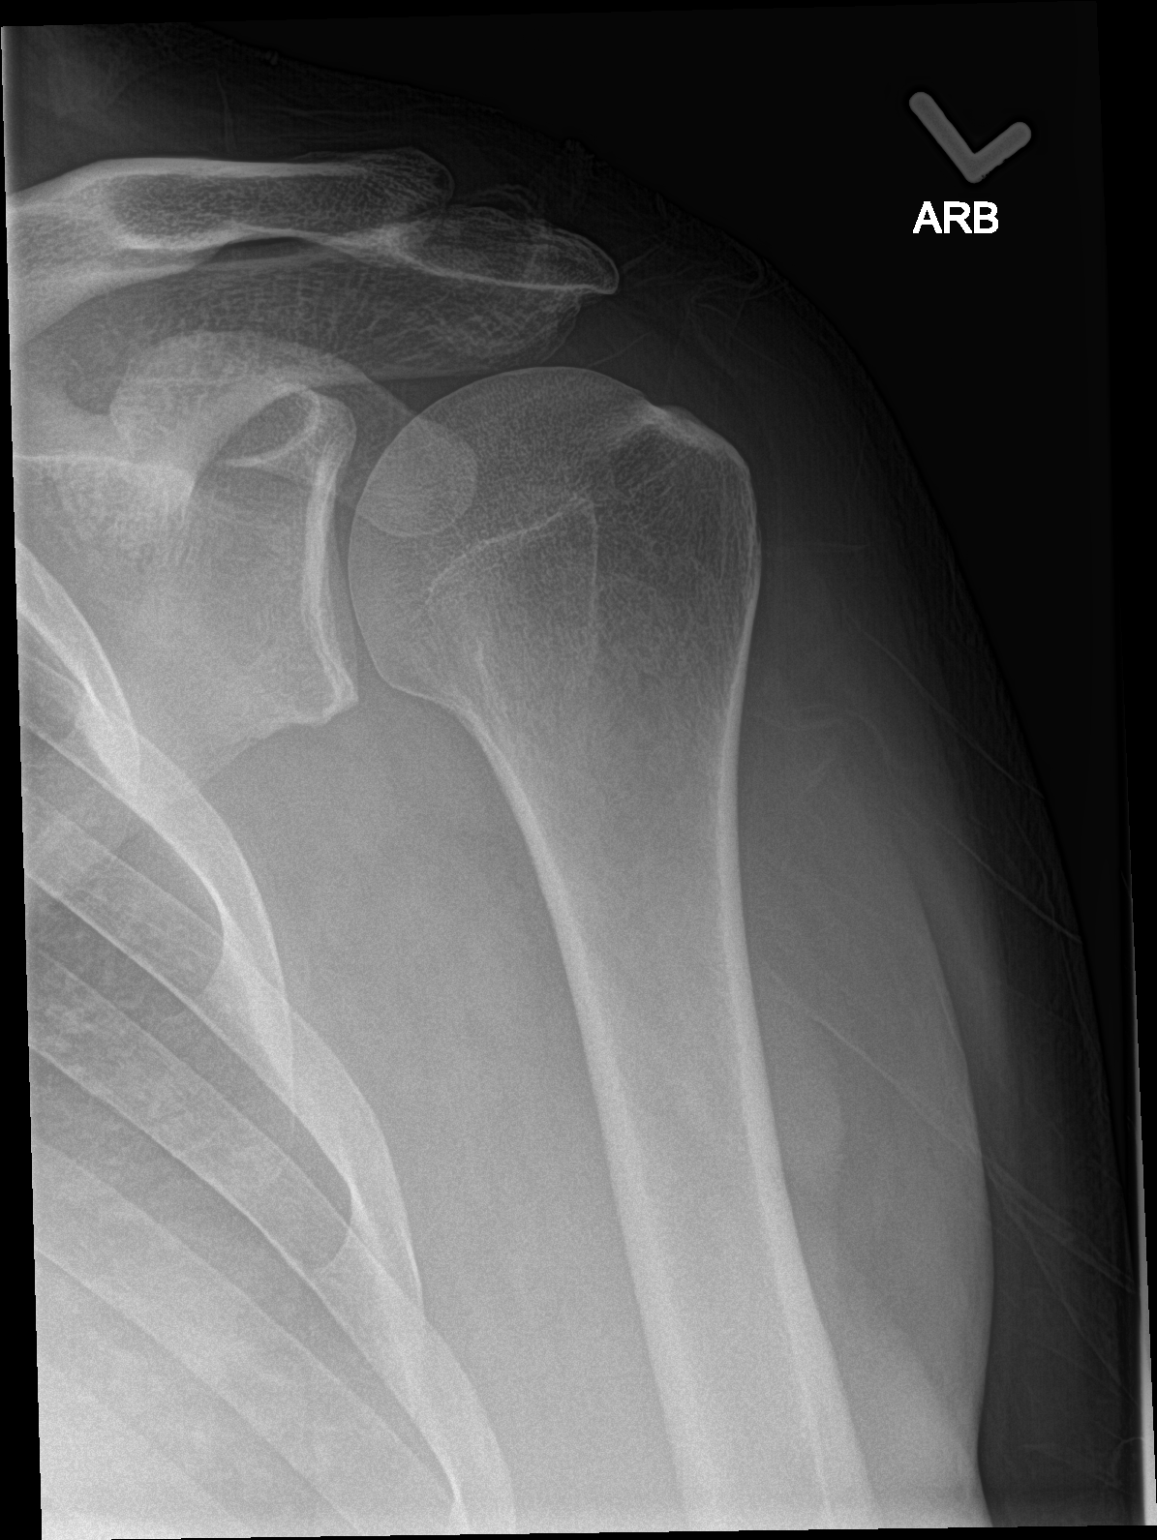

[shoulder y view]
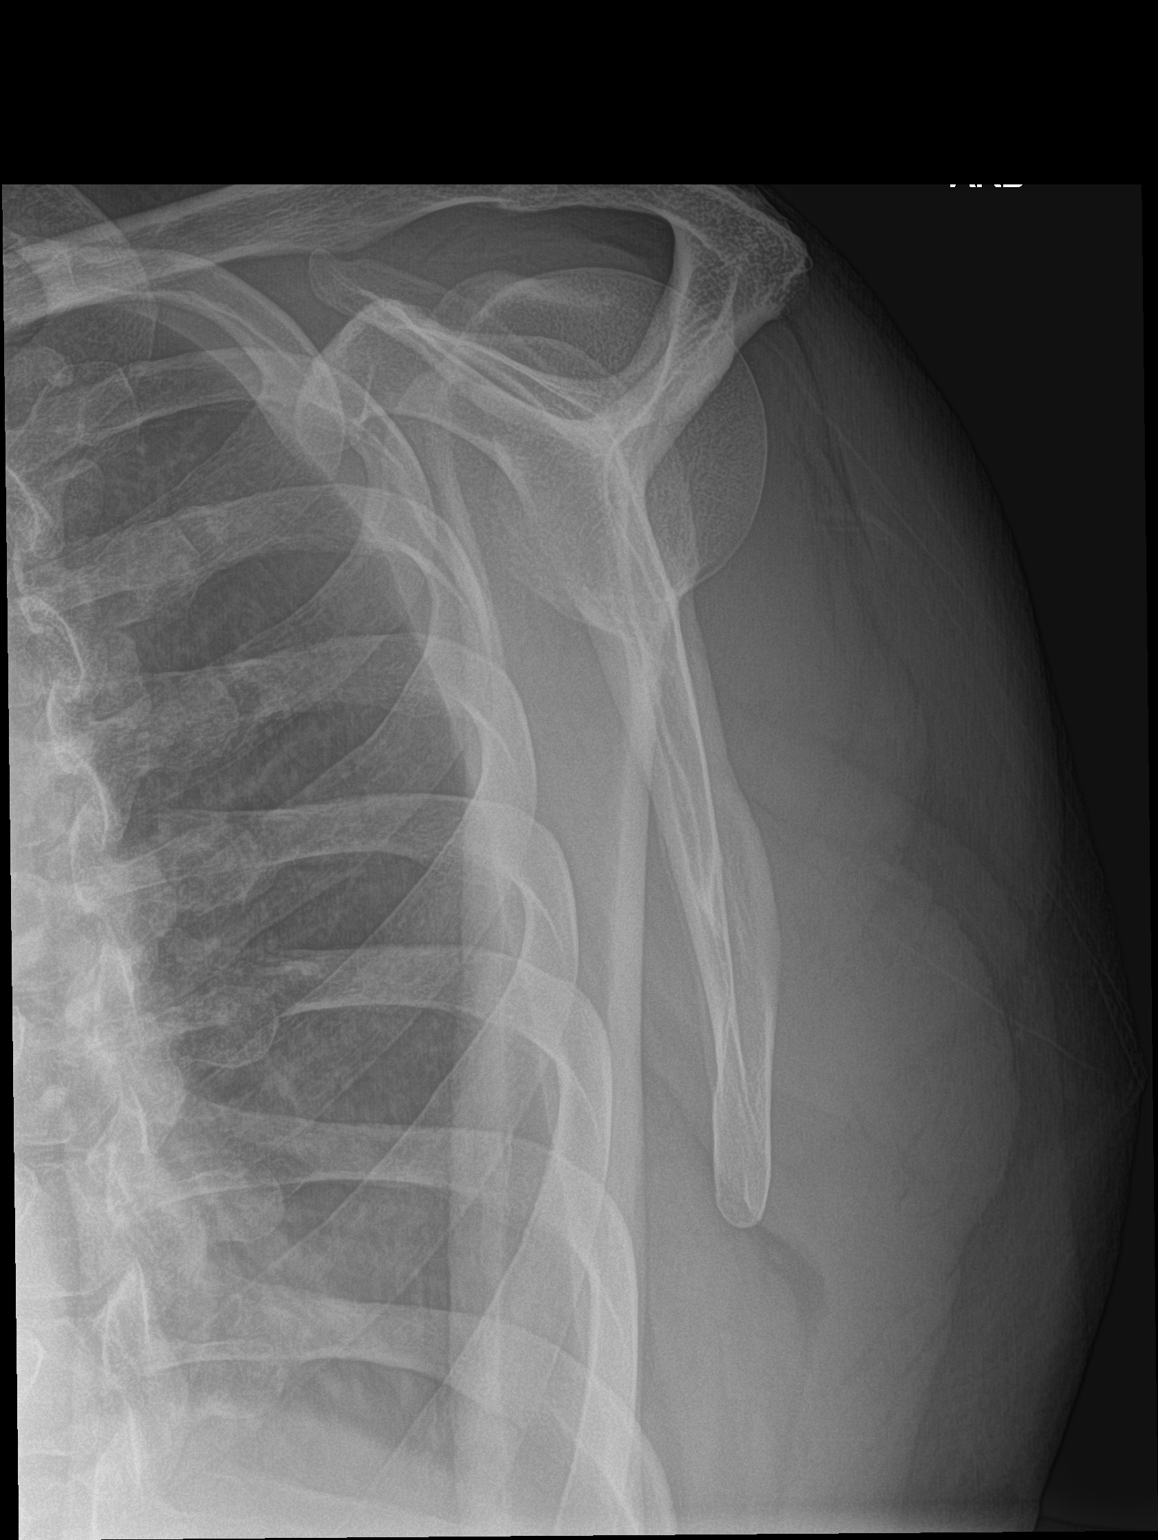

[shoulder axillary]
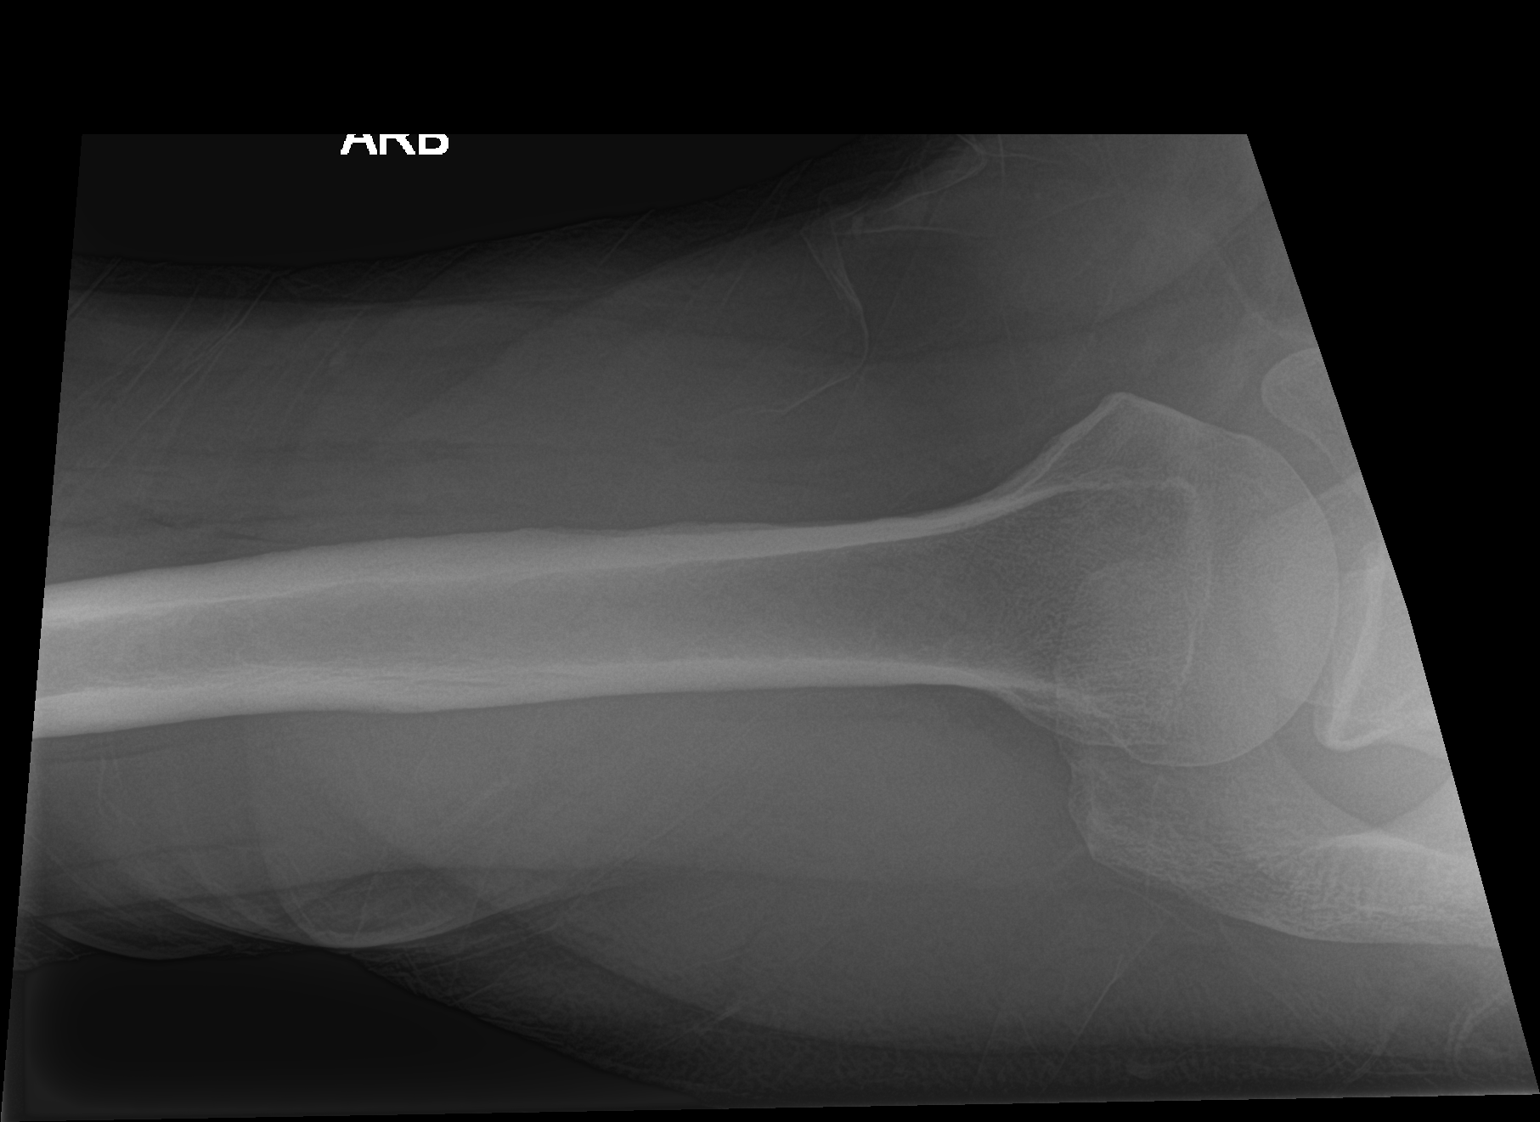

[3 of 3 positions shown; findings below may reference images not displayed]

FINDINGS: There is no evidence of fracture or dislocation. There is no
evidence of arthropathy or other focal bone abnormality. Soft
tissues are unremarkable.
IMPRESSION: Negative.

## 2017-04-22 ENCOUNTER — Ambulatory Visit: Payer: Self-pay | Admitting: Gastroenterology

## 2017-06-10 ENCOUNTER — Emergency Department: Payer: Self-pay

## 2017-06-10 ENCOUNTER — Encounter: Payer: Self-pay | Admitting: Emergency Medicine

## 2017-06-10 ENCOUNTER — Emergency Department
Admission: EM | Admit: 2017-06-10 | Discharge: 2017-06-10 | Disposition: A | Discharge status: Home / Self Care | Payer: Self-pay | Attending: Emergency Medicine | Admitting: Emergency Medicine

## 2017-06-10 DIAGNOSIS — M79672 Pain in left foot: Secondary | ICD-10-CM | POA: Insufficient documentation

## 2017-06-10 DIAGNOSIS — F17200 Nicotine dependence, unspecified, uncomplicated: Secondary | ICD-10-CM | POA: Insufficient documentation

## 2017-06-10 DIAGNOSIS — Z79899 Other long term (current) drug therapy: Secondary | ICD-10-CM | POA: Insufficient documentation

## 2017-06-10 MED ORDER — NAPROXEN 500 MG PO TABS: 500.0000 mg | ORAL_TABLET | Freq: Two times a day (BID) | ORAL | 0 refills | Status: DC

## 2017-06-10 MED ORDER — TRAMADOL HCL 50 MG PO TABS: 50.0000 mg | ORAL_TABLET | Freq: Four times a day (QID) | ORAL | 0 refills | Status: DC | PRN

## 2017-06-10 NOTE — ED Triage Notes (Signed)
Stepped off ladder this am around 8am and states hurts when standing. hypertended toes when it occurred. No deformities, swelling or bruising noted.

## 2017-06-10 NOTE — ED Provider Notes (Signed)
[facility] Emergency Department Provider Note   ____________________________________________   First MD Initiated Contact with Patient 06/10/17 1102     (approximate)  I have reviewed the triage vital signs and the nursing notes.   HISTORY  Chief Complaint Foot Pain    HPI Stanley Leach is a 46 y.o. male patient complaining of left foot pain status post stepping off a ladder this morning. He stated pain increases with weightbearing and hyperextension of the toes. Patient concerned due to previous fracture same location 2-1/2-3 years ago. No palliative measures for complaint.   History reviewed. No pertinent past medical history.  There are no active problems to display for this patient.   History reviewed. No pertinent surgical history.  Prior to Admission medications   Medication Sig Start Date End Date Taking? Authorizing Provider  diazepam (VALIUM) 2 MG tablet Take 1 tablet (2 mg total) by mouth every 8 (eight) hours as needed for muscle spasms. 12/24/16   Tommi Rumps, PA-C  HYDROcodone-acetaminophen (NORCO/VICODIN) 5-325 MG tablet Take 1 tablet by mouth every 4 (four) hours as needed for moderate pain. 12/24/16   Tommi Rumps, PA-C  naproxen (NAPROSYN) 500 MG tablet Take 1 tablet (500 mg total) by mouth 2 (two) times daily with a meal. 12/24/16   Tommi Rumps, PA-C  naproxen (NAPROSYN) 500 MG tablet Take 1 tablet (500 mg total) by mouth 2 (two) times daily with a meal. 06/10/17   Joni Reining, PA-C  traMADol (ULTRAM) 50 MG tablet Take 1 tablet (50 mg total) by mouth every 6 (six) hours as needed for moderate pain. 06/10/17   Joni Reining, PA-C    Allergies Patient has no known allergies.  No family history on file.  Social History Social History  Substance Use Topics  . Smoking status: Heavy Tobacco Smoker    Packs/day: 0.50  . Smokeless tobacco: Never Used  . Alcohol use Yes    Review of  Systems  Constitutional: No fever/chills Eyes: No visual changes. ENT: No sore throat. Cardiovascular: Denies chest pain. Respiratory: Denies shortness of breath. Gastrointestinal: No abdominal pain.  No nausea, no vomiting.  No diarrhea.  No constipation. Genitourinary: Negative for dysuria. Musculoskeletal: Left foot pain  Skin: Negative for rash. Neurological: Negative for headaches, focal weakness or numbness.   ____________________________________________   PHYSICAL EXAM:  VITAL SIGNS: ED Triage Vitals  Enc Vitals Group     BP      Pulse      Resp      Temp      Temp src      SpO2      Weight      Height      Head Circumference      Peak Flow      Pain Score      Pain Loc      Pain Edu?      Excl. in GC?    Constitutional: Alert and oriented. Well appearing and in no acute distress. Hematological/Lymphatic/Immunilogical: No cervical lymphadenopathy. Cardiovascular: Normal rate, regular rhythm. Grossly normal heart sounds.  Good peripheral circulation. Respiratory: Normal respiratory effort.  No retractions. Lungs CTAB. Gastrointestinal: Soft and nontender. No distention. No abdominal bruits. No CVA tenderness. Musculoskeletal: No obvious deformity to the left foot. Moderate guarding palpation of the mid phalange third and fourth digit.  Neurologic:  Normal speech and language. No gross focal neurologic deficits are appreciated. No gait instability. Skin:  Skin is warm, dry  and intact. No rash noted. Psychiatric: Mood and affect are normal. Speech and behavior are normal.  ____________________________________________   LABS (all labs ordered are listed, but only abnormal results are displayed)  Labs Reviewed - No data to display ____________________________________________  EKG   ____________________________________________  RADIOLOGY  Dg Foot Complete Left  Result Date: 06/10/2017 CLINICAL DATA:  Stepped off ladder hyperextending the toes with pain  EXAM: LEFT FOOT - COMPLETE 3+ VIEW COMPARISON:  Left foot films of 03/22/2014 FINDINGS: Tarsal-metatarsal alignment is normal. No fracture is seen. Joint spaces appear normal. No erosion is noted. A small degenerative spur is noted emanating from the Achilles insertion upon the posterior calcaneus. IMPRESSION: No acute abnormality.  Mild degenerative change. Electronically Signed   By: Dwyane Dee M.D.   On: 06/10/2017 11:32    ____________________________________________   PROCEDURES  Procedure(s) performed: None  Procedures  Critical Care performed: No  ____________________________________________   INITIAL IMPRESSION / ASSESSMENT AND PLAN / ED COURSE  Pertinent labs & imaging results that were available during my care of the patient were reviewed by me and considered in my medical decision making (see chart for details).  Right foot pain secondary to strain. Discussed negative x-ray findings with patient. Patient given discharge care instructions. Patient advised follow-up with the "clinic if condition persists.      ____________________________________________   FINAL CLINICAL IMPRESSION(S) / ED DIAGNOSES  Final diagnoses:  Foot pain, left      NEW MEDICATIONS STARTED DURING THIS VISIT:  New Prescriptions   NAPROXEN (NAPROSYN) 500 MG TABLET    Take 1 tablet (500 mg total) by mouth 2 (two) times daily with a meal.   TRAMADOL (ULTRAM) 50 MG TABLET    Take 1 tablet (50 mg total) by mouth every 6 (six) hours as needed for moderate pain.     Note:  This document was prepared using Dragon voice recognition software and may include unintentional dictation errors.    Joni Reining, PA-C 06/10/17 1148    Jene Every, MD 06/10/17 [phone]

## 2017-06-16 ENCOUNTER — Ambulatory Visit: Payer: Self-pay | Admitting: Gastroenterology

## 2017-09-08 ENCOUNTER — Other Ambulatory Visit: Payer: Self-pay

## 2017-09-08 ENCOUNTER — Encounter: Payer: Self-pay | Admitting: *Deleted

## 2017-09-08 ENCOUNTER — Emergency Department
Admission: EM | Admit: 2017-09-08 | Discharge: 2017-09-08 | Disposition: A | Discharge status: Home / Self Care | Payer: Self-pay | Attending: Emergency Medicine | Admitting: Emergency Medicine

## 2017-09-08 DIAGNOSIS — W270XXA Contact with workbench tool, initial encounter: Secondary | ICD-10-CM | POA: Insufficient documentation

## 2017-09-08 DIAGNOSIS — Y929 Unspecified place or not applicable: Secondary | ICD-10-CM | POA: Insufficient documentation

## 2017-09-08 DIAGNOSIS — S61210A Laceration without foreign body of right index finger without damage to nail, initial encounter: Secondary | ICD-10-CM | POA: Insufficient documentation

## 2017-09-08 DIAGNOSIS — Y93H9 Activity, other involving exterior property and land maintenance, building and construction: Secondary | ICD-10-CM | POA: Insufficient documentation

## 2017-09-08 DIAGNOSIS — Z23 Encounter for immunization: Secondary | ICD-10-CM | POA: Insufficient documentation

## 2017-09-08 DIAGNOSIS — Y999 Unspecified external cause status: Secondary | ICD-10-CM | POA: Insufficient documentation

## 2017-09-08 DIAGNOSIS — Z791 Long term (current) use of non-steroidal anti-inflammatories (NSAID): Secondary | ICD-10-CM | POA: Insufficient documentation

## 2017-09-08 DIAGNOSIS — F17200 Nicotine dependence, unspecified, uncomplicated: Secondary | ICD-10-CM | POA: Insufficient documentation

## 2017-09-08 MED ORDER — LIDOCAINE HCL (PF) 1 % IJ SOLN
5.0000 mL | Freq: Once | INTRAMUSCULAR | Status: AC
  Administered 2017-09-08: 5 mL
  Filled 2017-09-08: qty 5

## 2017-09-08 MED ORDER — TETANUS-DIPHTH-ACELL PERTUSSIS 5-2.5-18.5 LF-MCG/0.5 IM SUSP
0.5000 mL | Freq: Once | INTRAMUSCULAR | Status: AC
  Administered 2017-09-08: 0.5 mL via INTRAMUSCULAR
  Filled 2017-09-08: qty 0.5

## 2017-09-08 MED ORDER — PENTAFLUOROPROP-TETRAFLUOROETH EX AERO
1.0000 "application " | INHALATION_SPRAY | CUTANEOUS | Status: DC | PRN
  Administered 2017-09-08: 1 via TOPICAL
  Filled 2017-09-08: qty 30

## 2017-09-08 NOTE — ED Triage Notes (Signed)
Pt to ED reporting having cut right hand pointer finger this morning with a saw. Bleeding controlled at this time but laceration noted to tip of finger.

## 2017-09-08 NOTE — Discharge Instructions (Signed)
Keep the wound clean, dry, and covered. Wash only with soap & water. [facility] in 10 days for suture removal.

## 2017-09-08 NOTE — ED Provider Notes (Signed)
[facility] Emergency Department Provider Note ____________________________________________  Time seen: 1355  I have reviewed the triage vital signs and the nursing notes.  HISTORY  Chief Complaint  Finger Injury  HPI Stanley Leach is a 46 y.o. male presents to the ED for evaluation of a laceration to his right index finger.  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  He accidentally cut his finger when the saw got caught up on exposed nail.  He denies any other injury at this time.  He is unclear of his most recent tetanus status.  History reviewed. No pertinent past medical history.  There are no active problems to display for this patient.  History reviewed. No pertinent surgical history.  Prior to Admission medications   Medication Sig Start Date End Date Taking? Authorizing Provider  diazepam (VALIUM) 2 MG tablet Take 1 tablet (2 mg total) by mouth every 8 (eight) hours as needed for muscle spasms. 12/24/16   Tommi RumpsSummers, Rhonda L, PA-C  HYDROcodone-acetaminophen (NORCO/VICODIN) 5-325 MG tablet Take 1 tablet by mouth every 4 (four) hours as needed for moderate pain. 12/24/16   Tommi RumpsSummers, Rhonda L, PA-C  naproxen (NAPROSYN) 500 MG tablet Take 1 tablet (500 mg total) by mouth 2 (two) times daily with a meal. 12/24/16   Tommi RumpsSummers, Rhonda L, PA-C  naproxen (NAPROSYN) 500 MG tablet Take 1 tablet (500 mg total) by mouth 2 (two) times daily with a meal. 06/10/17   Joni ReiningSmith, Ronald K, PA-C  traMADol (ULTRAM) 50 MG tablet Take 1 tablet (50 mg total) by mouth every 6 (six) hours as needed for moderate pain. 06/10/17   Joni ReiningSmith, Ronald K, PA-C   Allergies Patient has no known allergies.  History reviewed. No pertinent family history.  Social History Social History   Tobacco Use  . Smoking status: Heavy Tobacco Smoker    Packs/day: 0.50  . Smokeless tobacco: Never Used  Substance Use Topics  . Alcohol use: Yes  . Drug use: No     Review of Systems  Constitutional: Negative for fever. Cardiovascular: Negative for chest pain. Respiratory: Negative for shortness of breath. Musculoskeletal: Negative for back pain.  Skin: Negative for rash. Right index finger laceration as above Neurological: Negative for headaches, focal weakness or numbness. ____________________________________________  PHYSICAL EXAM:  VITAL SIGNS: ED Triage Vitals  Enc Vitals Group     BP 09/08/17 1224 (!) 146/82     Pulse Rate 09/08/17 1224 93     Resp 09/08/17 1224 16     Temp 09/08/17 1224 98.9 F (37.2 C)     Temp Source 09/08/17 1224 Oral     SpO2 09/08/17 1224 98 %     Weight 09/08/17 1225 240 lb (108.9 kg)     Height 09/08/17 1225 6\' 2"  (1.88 m)     Head Circumference --      Peak Flow --      Pain Score 09/08/17 1224 3     Pain Loc --      Pain Edu? --      Excl. in GC? --     Constitutional: Alert and oriented. Well appearing and in no distress. Head: Normocephalic and atraumatic. Cardiovascular: Normal rate, regular rhythm. Normal distal pulses. Respiratory: Normal respiratory effort. Musculoskeletal: Normal composite fist. Right index finger with a laceration to the fat pad. Nontender with normal range of motion in all extremities.  Neurologic:  CN II-XI grossly intact. Normal gross sensation. Normal speech and language.  No gross focal neurologic deficits are appreciated. Skin:  Skin is warm, dry and intact. No rash noted. ____________________________________________  PROCEDURES  Tdap 0.5 ml IM  .Marland Kitchen.Laceration Repair Date/Time: 09/08/2017 4:21 PM Performed by: Lissa HoardMenshew, Irie Fiorello V Bacon, PA-C Authorized by: Lissa HoardMenshew, Mylani Gentry V Bacon, PA-C   Consent:    Consent obtained:  Verbal   Consent given by:  Patient   Risks discussed:  Infection and poor wound healing Anesthesia (see MAR for exact dosages):    Anesthesia method:  Local infiltration and nerve block   Local anesthetic:  Lidocaine 1% w/o epi   Block  location:  Flexor tendon thecal sac   Block needle gauge:  27 G   Block anesthetic:  Lidocaine 1% w/o epi   Block injection procedure:  Introduced needle, incremental injection and anatomic landmarks palpated   Block outcome:  Anesthesia achieved Laceration details:    Location:  Finger   Finger location:  R index finger   Length (cm):  1.5   Depth (mm):  2 Repair type:    Repair type:  Simple Pre-procedure details:    Preparation:  Patient was prepped and draped in usual sterile fashion Treatment:    Area cleansed with:  Betadine   Amount of cleaning:  Standard   Irrigation solution:  Sterile saline Skin repair:    Repair method:  Sutures   Suture size:  5-0   Suture material:  Nylon   Suture technique:  Simple interrupted   Number of sutures:  3 Approximation:    Approximation:  Close   Vermilion border: well-aligned   Post-procedure details:    Dressing:  Non-adherent dressing  ____________________________________________  INITIAL IMPRESSION / ASSESSMENT AND PLAN / ED COURSE  ED evaluation of an accidental laceration to the distal right index finger.  The patient's wound is appropriately prepped and draped in suture repair is warm.  Good wound approximation is achieved.  He is given wound care instructions upon discharge.  Will see his primary care provider or [facility] for suture removal in 7-10 days. ____________________________________________  FINAL CLINICAL IMPRESSION(S) / ED DIAGNOSES  Final diagnoses:  Laceration of right index finger without foreign body without damage to nail, initial encounter     Lissa HoardMenshew, Nicola Quesnell V Bacon, PA-C 09/08/17 1627    Sharman CheekStafford, Phillip, MD 09/21/17 2010

## 2017-10-17 ENCOUNTER — Other Ambulatory Visit: Payer: Self-pay

## 2017-10-17 ENCOUNTER — Encounter: Payer: Self-pay | Admitting: Emergency Medicine

## 2017-10-17 ENCOUNTER — Emergency Department
Admission: EM | Admit: 2017-10-17 | Discharge: 2017-10-17 | Disposition: A | Discharge status: Home / Self Care | Payer: Self-pay | Attending: Emergency Medicine | Admitting: Emergency Medicine

## 2017-10-17 DIAGNOSIS — R42 Dizziness and giddiness: Secondary | ICD-10-CM | POA: Insufficient documentation

## 2017-10-17 DIAGNOSIS — F17210 Nicotine dependence, cigarettes, uncomplicated: Secondary | ICD-10-CM | POA: Insufficient documentation

## 2017-10-17 DIAGNOSIS — Z79899 Other long term (current) drug therapy: Secondary | ICD-10-CM | POA: Insufficient documentation

## 2017-10-17 LAB — BASIC METABOLIC PANEL
ANION GAP: 8 (ref 5–15)
BUN: 9 mg/dL (ref 6–20)
CO2: 31 mmol/L (ref 22–32)
Calcium: 9.2 mg/dL (ref 8.9–10.3)
Chloride: 100 mmol/L — ABNORMAL LOW (ref 101–111)
Creatinine, Ser: 0.75 mg/dL (ref 0.61–1.24)
GFR calc Af Amer: >60 mL/min (ref >60)
GFR calc non Af Amer: >60 mL/min (ref >60)
GLUCOSE: 100 mg/dL — AB (ref 65–99)
POTASSIUM: 4.4 mmol/L (ref 3.5–5.1)
Sodium: 139 mmol/L (ref 135–145)

## 2017-10-17 LAB — CBC
HEMATOCRIT: 49.7 % (ref 40.0–52.0)
HEMOGLOBIN: 16.7 g/dL (ref 13.0–18.0)
MCH: 31.5 pg (ref 26.0–34.0)
MCHC: 33.6 g/dL (ref 32.0–36.0)
MCV: 93.8 fL (ref 80.0–100.0)
Platelets: 347 10*3/uL (ref 150–440)
RBC: 5.3 MIL/uL (ref 4.40–5.90)
RDW: 13.5 % (ref 11.5–14.5)
WBC: 9.5 10*3/uL (ref 3.8–10.6)

## 2017-10-17 MED ORDER — MECLIZINE HCL 25 MG PO TABS: 25.0000 mg | ORAL_TABLET | Freq: Three times a day (TID) | ORAL | 0 refills | Status: DC | PRN

## 2017-10-17 NOTE — ED Provider Notes (Signed)
[facility] Emergency Department Provider Note   ____________________________________________    I have reviewed the triage vital signs and the nursing notes.   HISTORY  Chief Complaint Dizziness     HPI Stanley Leach is a 47 y.o. male who presents with complaints of dizziness.  Patient describes it when he woke up this morning he felt as if the room was not quite as steady as he was used to.  He denies lightheadedness.  No palpitations no chest pain.  No nausea or vomiting or neuro deficits.  No headache.  No trauma reported.  He has never had this before.  He is not take anything for it.  Moving seems to make it worse.  No ear pain or tinnitus   History reviewed. No pertinent past medical history.  There are no active problems to display for this patient.   History reviewed. No pertinent surgical history.  Prior to Admission medications   Medication Sig Start Date End Date Taking? Authorizing Provider  diazepam (VALIUM) 2 MG tablet Take 1 tablet (2 mg total) by mouth every 8 (eight) hours as needed for muscle spasms. 12/24/16   Tommi RumpsSummers, Rhonda L, PA-C  HYDROcodone-acetaminophen (NORCO/VICODIN) 5-325 MG tablet Take 1 tablet by mouth every 4 (four) hours as needed for moderate pain. 12/24/16   Tommi RumpsSummers, Rhonda L, PA-C  meclizine (ANTIVERT) 25 MG tablet Take 1 tablet (25 mg total) by mouth 3 (three) times daily as needed for dizziness. 10/17/17   Jene EveryKinner, Arrow Tomko, MD  naproxen (NAPROSYN) 500 MG tablet Take 1 tablet (500 mg total) by mouth 2 (two) times daily with a meal. 12/24/16   Tommi RumpsSummers, Rhonda L, PA-C  naproxen (NAPROSYN) 500 MG tablet Take 1 tablet (500 mg total) by mouth 2 (two) times daily with a meal. 06/10/17   Joni ReiningSmith, Ronald K, PA-C  traMADol (ULTRAM) 50 MG tablet Take 1 tablet (50 mg total) by mouth every 6 (six) hours as needed for moderate pain. 06/10/17   Joni ReiningSmith, Ronald K, PA-C     Allergies Patient has no known allergies.  No family  history on file.  Social History Social History   Tobacco Use  . Smoking status: Heavy Tobacco Smoker    Packs/day: 0.50  . Smokeless tobacco: Never Used  Substance Use Topics  . Alcohol use: Yes  . Drug use: No    Review of Systems  Constitutional: No fever/chills Eyes: No visual changes.  ENT: No tinnitus Cardiovascular: Denies chest pain. Respiratory: Denies shortness of breath. Gastrointestinal: No abdominal pain.  No nausea, no vomiting.   Genitourinary: Negative for dysuria. Musculoskeletal: Negative for back pain. Skin: Negative for rash. Neurological: Negative for headaches or weakness   ____________________________________________   PHYSICAL EXAM:  VITAL SIGNS: ED Triage Vitals [10/17/17 1549]  Enc Vitals Group     BP 124/90     Pulse Rate 62     Resp 18     Temp 98.9 F (37.2 C)     Temp Source Oral     SpO2 96 %     Weight 104.3 kg (230 lb)     Height 1.854 m (6\' 1" )     Head Circumference      Peak Flow      Pain Score      Pain Loc      Pain Edu?      Excl. in GC?     Constitutional: Alert and oriented. No acute distress. Pleasant and interactive Eyes: Conjunctivae are  normal.  PERRLA, mild nystagmus  Nose: No congestion/rhinnorhea. Mouth/Throat: Mucous membranes are moist.    Cardiovascular: Normal rate, regular rhythm. Grossly normal heart sounds.  Good peripheral circulation. Respiratory: Normal respiratory effort.  No retractions.  Gastrointestinal: Soft and nontender. No distention.   Genitourinary: deferred Musculoskeletal:   Warm and well perfused Neurologic:  Normal speech and language. No gross focal neurologic deficits are appreciated.  Skin:  Skin is warm, dry and intact. No rash noted. Psychiatric: Mood and affect are normal. Speech and behavior are normal.  ____________________________________________   LABS (all labs ordered are listed, but only abnormal results are displayed)  Labs Reviewed  BASIC METABOLIC PANEL -  Abnormal; Notable for the following components:      Result Value   Chloride 100 (*)    Glucose, Bld 100 (*)    All other components within normal limits  CBC   ____________________________________________  EKG  ED ECG REPORT I, Jene Every, the attending physician, personally viewed and interpreted this ECG.  Date: 10/17/2017  Rhythm: normal sinus rhythm QRS Axis: normal Intervals: normal ST/T Wave abnormalities: normal Narrative Interpretation: no evidence of acute ischemia  ____________________________________________  RADIOLOGY  None ____________________________________________   PROCEDURES  Procedure(s) performed: No  Procedures   Critical Care performed: No ____________________________________________   INITIAL IMPRESSION / ASSESSMENT AND PLAN / ED COURSE  Pertinent labs & imaging results that were available during my care of the patient were reviewed by me and considered in my medical decision making (see chart for details).  Patient well-appearing in no acute distress.  Exam is unremarkable and symptoms of mostly resolved on my exam.  Symptoms appear to be consistent with vertigo.  Will treat with meclizine and have the patient follow-up with PCP as needed.  Return precautions discussed    ____________________________________________   FINAL CLINICAL IMPRESSION(S) / ED DIAGNOSES  Final diagnoses:  Vertigo        Note:  This document was prepared using Dragon voice recognition software and may include unintentional dictation errors.    Jene Every, MD 10/17/17 2219

## 2017-10-17 NOTE — ED Triage Notes (Signed)
Pt to ED via POV. Pt c/o dizziness that started this morning. Pt states that he feels like he is drunk, denies N/V. Pt states that standing up makes the dizziness worse. Pt denies hx/o vertigo. Pt in NAD at this time.

## 2018-08-07 ENCOUNTER — Encounter: Payer: Self-pay | Admitting: Emergency Medicine

## 2018-08-07 ENCOUNTER — Emergency Department: Payer: Self-pay

## 2018-08-07 ENCOUNTER — Emergency Department
Admission: EM | Admit: 2018-08-07 | Discharge: 2018-08-07 | Disposition: A | Discharge status: Home / Self Care | Payer: Self-pay | Attending: Emergency Medicine | Admitting: Emergency Medicine

## 2018-08-07 ENCOUNTER — Other Ambulatory Visit: Payer: Self-pay

## 2018-08-07 DIAGNOSIS — Y9289 Other specified places as the place of occurrence of the external cause: Secondary | ICD-10-CM | POA: Insufficient documentation

## 2018-08-07 DIAGNOSIS — S8391XA Sprain of unspecified site of right knee, initial encounter: Secondary | ICD-10-CM | POA: Insufficient documentation

## 2018-08-07 DIAGNOSIS — X58XXXA Exposure to other specified factors, initial encounter: Secondary | ICD-10-CM | POA: Insufficient documentation

## 2018-08-07 DIAGNOSIS — Y9389 Activity, other specified: Secondary | ICD-10-CM | POA: Insufficient documentation

## 2018-08-07 DIAGNOSIS — Y990 Civilian activity done for income or pay: Secondary | ICD-10-CM | POA: Insufficient documentation

## 2018-08-07 DIAGNOSIS — F17200 Nicotine dependence, unspecified, uncomplicated: Secondary | ICD-10-CM | POA: Insufficient documentation

## 2018-08-07 DIAGNOSIS — Z79899 Other long term (current) drug therapy: Secondary | ICD-10-CM | POA: Insufficient documentation

## 2018-08-07 MED ORDER — KETOROLAC TROMETHAMINE 10 MG PO TABS: 10.0000 mg | ORAL_TABLET | Freq: Four times a day (QID) | ORAL | 0 refills | Status: DC | PRN

## 2018-08-07 MED ORDER — KETOROLAC TROMETHAMINE 30 MG/ML IJ SOLN
30.0000 mg | Freq: Once | INTRAMUSCULAR | Status: AC
  Administered 2018-08-07: 30 mg via INTRAMUSCULAR
  Filled 2018-08-07: qty 1

## 2018-08-07 NOTE — ED Triage Notes (Signed)
Pt arrived via POV with reports of right knee pain from injury last night from trip and fall.   Pt walking with limp.

## 2018-08-07 NOTE — ED Notes (Signed)
See triage note  Presents with right knee pain  States pain is mainly lateral and posterior   Denies any injury  No swelling noted
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
## 2018-11-03 ENCOUNTER — Emergency Department: Payer: Self-pay

## 2018-11-03 ENCOUNTER — Emergency Department
Admission: EM | Admit: 2018-11-03 | Discharge: 2018-11-03 | Disposition: A | Discharge status: Home / Self Care | Payer: Self-pay | Attending: Student in an Organized Health Care Education/Training Program | Admitting: Student in an Organized Health Care Education/Training Program

## 2018-11-03 ENCOUNTER — Encounter: Payer: Self-pay | Admitting: Emergency Medicine

## 2018-11-03 ENCOUNTER — Other Ambulatory Visit: Payer: Self-pay

## 2018-11-03 DIAGNOSIS — R42 Dizziness and giddiness: Secondary | ICD-10-CM | POA: Insufficient documentation

## 2018-11-03 DIAGNOSIS — R4582 Worries: Secondary | ICD-10-CM | POA: Insufficient documentation

## 2018-11-03 DIAGNOSIS — F419 Anxiety disorder, unspecified: Secondary | ICD-10-CM | POA: Insufficient documentation

## 2018-11-03 DIAGNOSIS — F17210 Nicotine dependence, cigarettes, uncomplicated: Secondary | ICD-10-CM | POA: Insufficient documentation

## 2018-11-03 DIAGNOSIS — R0789 Other chest pain: Secondary | ICD-10-CM | POA: Insufficient documentation

## 2018-11-03 LAB — TROPONIN I: Troponin I: <0.03 ng/mL (ref <0.03)

## 2018-11-03 LAB — CBC
HEMATOCRIT: 48.8 % (ref 39.0–52.0)
HEMOGLOBIN: 16.2 g/dL (ref 13.0–17.0)
MCH: 31 pg (ref 26.0–34.0)
MCHC: 33.2 g/dL (ref 30.0–36.0)
MCV: 93.5 fL (ref 80.0–100.0)
NRBC: 0 % (ref 0.0–0.2)
Platelets: 421 10*3/uL — ABNORMAL HIGH (ref 150–400)
RBC: 5.22 MIL/uL (ref 4.22–5.81)
RDW: 12.2 % (ref 11.5–15.5)
WBC: 10.9 10*3/uL — AB (ref 4.0–10.5)

## 2018-11-03 LAB — BASIC METABOLIC PANEL
Anion gap: 7 (ref 5–15)
BUN: 10 mg/dL (ref 6–20)
CHLORIDE: 104 mmol/L (ref 98–111)
CO2: 26 mmol/L (ref 22–32)
CREATININE: 0.8 mg/dL (ref 0.61–1.24)
Calcium: 9.1 mg/dL (ref 8.9–10.3)
GFR calc Af Amer: >60 mL/min (ref >60)
Glucose, Bld: 118 mg/dL — ABNORMAL HIGH (ref 70–99)
POTASSIUM: 3.8 mmol/L (ref 3.5–5.1)
Sodium: 137 mmol/L (ref 135–145)

## 2018-11-03 MED ORDER — AZITHROMYCIN 500 MG PO TABS: 500.0000 mg | ORAL_TABLET | Freq: Every day | ORAL | 0 refills | Status: AC

## 2018-11-03 NOTE — ED Provider Notes (Signed)
[facility] Emergency Department Provider Note    First MD Initiated Contact with Patient 11/03/18 1515     (approximate)  I have reviewed the triage vital signs and the nursing notes.   HISTORY  Chief Complaint Dizziness    HPI Stanley Leach is a 48 y.o. male presents the ER for evaluation of lightheadedness is been happening intermittently for the past week or so.  Symptoms are associated with dull achy pressure in the left side of his chest occurs intermittently.  States the last roughly 4 to 5 minutes.  Denies any palpitations.  States he has felt very anxious and several the events feel like he is unable to catch his breath and feeling an overwhelming sense of doom.  States he has been under quite a bit of stress over the past few months.  States he did have one episode prior to arrival.  Denies any pain at this time.  Has had some congestion.  Does not currently smoke but does have a history of smoking.  Does not have any history of high blood pressure, diabetes    History reviewed. No pertinent past medical history. No family history on file. History reviewed. No pertinent surgical history. There are no active problems to display for this patient.     Prior to Admission medications   Medication Sig Start Date End Date Taking? Authorizing Provider  azithromycin (ZITHROMAX) 500 MG tablet Take 1 tablet (500 mg total) by mouth daily for 3 days. Take 1 tablet daily for 3 days. 11/03/18 11/06/18  Willy Eddy, MD    Allergies Patient has no known allergies.    Social History Social History   Tobacco Use  . Smoking status: Heavy Tobacco Smoker    Packs/day: 0.50  . Smokeless tobacco: Never Used  Substance Use Topics  . Alcohol use: Yes  . Drug use: No    Review of Systems Patient denies headaches, rhinorrhea, blurry vision, numbness, shortness of breath, chest pain, edema, cough, abdominal pain, nausea, vomiting, diarrhea,  dysuria, fevers, rashes or hallucinations unless otherwise stated above in HPI. ____________________________________________   PHYSICAL EXAM:  VITAL SIGNS: Vitals:   11/03/18 1630 11/03/18 1650  BP: 130/78   Pulse:    Resp: (!) 21   Temp:    SpO2:  97%    Constitutional: Alert and oriented.  Eyes: Conjunctivae are normal.  Head: Atraumatic. Nose: No congestion/rhinnorhea. Mouth/Throat: Mucous membranes are moist.   Neck: No stridor. Painless ROM.  Cardiovascular: Normal rate, regular rhythm. Grossly normal heart sounds.  Good peripheral circulation. Respiratory: Normal respiratory effort.  No retractions. Lungs CTAB. Gastrointestinal: Soft and nontender. No distention. No abdominal bruits. No CVA tenderness. Genitourinary:  Musculoskeletal: No lower extremity tenderness nor edema.  No joint effusions. Neurologic:  Normal speech and language. No gross focal neurologic deficits are appreciated. No facial droop Skin:  Skin is warm, dry and intact. No rash noted. Psychiatric: Mood and affect are normal. Speech and behavior are normal.  ____________________________________________   LABS (all labs ordered are listed, but only abnormal results are displayed)  Results for orders placed or performed during the hospital encounter of 11/03/18 (from the past 24 hour(s))  Basic metabolic panel     Status: Abnormal   Collection Time: 11/03/18  2:49 PM  Result Value Ref Range   Sodium 137 135 - 145 mmol/L   Potassium 3.8 3.5 - 5.1 mmol/L   Chloride 104 98 - 111 mmol/L   CO2 26 22 -  32 mmol/L   Glucose, Bld 118 (H) 70 - 99 mg/dL   BUN 10 6 - 20 mg/dL   Creatinine, Ser 5.78 0.61 - 1.24 mg/dL   Calcium 9.1 8.9 - 46.9 mg/dL   GFR calc non Af Amer >60 >60 mL/min   GFR calc Af Amer >60 >60 mL/min   Anion gap 7 5 - 15  CBC     Status: Abnormal   Collection Time: 11/03/18  2:49 PM  Result Value Ref Range   WBC 10.9 (H) 4.0 - 10.5 K/uL   RBC 5.22 4.22 - 5.81 MIL/uL   Hemoglobin 16.2  13.0 - 17.0 g/dL   HCT 62.9 52.8 - 41.3 %   MCV 93.5 80.0 - 100.0 fL   MCH 31.0 26.0 - 34.0 pg   MCHC 33.2 30.0 - 36.0 g/dL   RDW 24.4 01.0 - 27.2 %   Platelets 421 (H) 150 - 400 K/uL   nRBC 0.0 0.0 - 0.2 %  Troponin I - ONCE - STAT     Status: None   Collection Time: 11/03/18  2:49 PM  Result Value Ref Range   Troponin I <0.03 <0.03 ng/mL  Troponin I - Once-Timed     Status: None   Collection Time: 11/03/18  5:50 PM  Result Value Ref Range   Troponin I <0.03 <0.03 ng/mL   ____________________________________________  EKG My review and personal interpretation at Time: 14:37   Indication: dizziness  Rate: 90  Rhythm: sinus Axis: normal Other: normal intervals, no stemi ____________________________________________  RADIOLOGY  I personally reviewed all radiographic images ordered to evaluate for the above acute complaints and reviewed radiology reports and findings.  These findings were personally discussed with the patient.  Please see medical record for radiology report.  ____________________________________________   PROCEDURES  Procedure(s) performed:  Procedures    Critical Care performed: no ____________________________________________   INITIAL IMPRESSION / ASSESSMENT AND PLAN / ED COURSE  Pertinent labs & imaging results that were available during my care of the patient were reviewed by me and considered in my medical decision making (see chart for details).   DDX: Dehydration, orthostasis, palpitations, electrolyte abnormality, ACS, dysrhythmia, anemia  Stanley Leach is a 48 y.o. who presents to the ED with symptoms as described above.  Patient well-appearing at this time.  Currently asymptomatic.  Hemodynamically stable.  Exam is reassuring.  He has no focal neuro deficits and is not consistent with a TIA or CVA.  Doubt partial seizure based on history.  Patient is low risk by Wells criteria and is PERC negative.  He is low risk heart score but will  further re-stratify with repeat enzyme.  Will monitor patient on telemetry.  Patient concerned that he may be having panic attack.  Certainly possible however will need to further evaluate for underlying pathology here in the ER.  Clinical Course as of Nov 03 1841  Thu Nov 03, 2018  [id number] Repeat troponin is negative.  At this point do believe patient stable and appropriate for outpatient follow-up.   [PR]    Clinical Course User Index [PR] Willy Eddy, MD     As part of my medical decision making, I reviewed the following data within the electronic MEDICAL RECORD NUMBER Nursing notes reviewed and incorporated, Labs reviewed, notes from prior ED visits and [place] Controlled Substance Database   ____________________________________________   FINAL CLINICAL IMPRESSION(S) / ED DIAGNOSES  Final diagnoses:  Dizziness  Atypical chest pain      NEW  MEDICATIONS STARTED DURING THIS VISIT:  New Prescriptions   AZITHROMYCIN (ZITHROMAX) 500 MG TABLET    Take 1 tablet (500 mg total) by mouth daily for 3 days. Take 1 tablet daily for 3 days.     Note:  This document was prepared using Dragon voice recognition software and may include unintentional dictation errors.    Willy Eddyobinson, Di Jasmer, MD 11/03/18 1843

## 2018-11-03 NOTE — ED Notes (Signed)
Pt given snack and drink. Verb okay per EDP Roxan Hockey.

## 2018-11-03 NOTE — ED Notes (Addendum)
Pt c/o of dizziness episodes at random ranging from to without a specific trigger he can pin-point. On-off sick with sinus issues since Christmas per pt. Not currently dizzy. Also c/o on-off L sided CP. Pt states he is not currently in CP.

## 2018-11-03 NOTE — ED Triage Notes (Signed)
Pt arrives with complaints of intermittent dizziness since Saturday. Pt states the episodes last between 2 minutes to 15 minutes. Pt denies exertion or movement as a trigger. Pt also reports discomfort to the left side of pt's chest.

## 2019-02-22 ENCOUNTER — Encounter: Payer: Self-pay | Admitting: Emergency Medicine

## 2019-02-22 ENCOUNTER — Other Ambulatory Visit: Payer: Self-pay

## 2019-02-22 ENCOUNTER — Emergency Department
Admission: EM | Admit: 2019-02-22 | Discharge: 2019-02-22 | Disposition: A | Discharge status: Home / Self Care | Payer: Self-pay | Attending: Emergency Medicine | Admitting: Emergency Medicine

## 2019-02-22 DIAGNOSIS — Y929 Unspecified place or not applicable: Secondary | ICD-10-CM | POA: Insufficient documentation

## 2019-02-22 DIAGNOSIS — M5440 Lumbago with sciatica, unspecified side: Secondary | ICD-10-CM | POA: Insufficient documentation

## 2019-02-22 DIAGNOSIS — Y999 Unspecified external cause status: Secondary | ICD-10-CM | POA: Insufficient documentation

## 2019-02-22 DIAGNOSIS — S39012A Strain of muscle, fascia and tendon of lower back, initial encounter: Secondary | ICD-10-CM | POA: Insufficient documentation

## 2019-02-22 DIAGNOSIS — Y93H2 Activity, gardening and landscaping: Secondary | ICD-10-CM | POA: Insufficient documentation

## 2019-02-22 DIAGNOSIS — F17200 Nicotine dependence, unspecified, uncomplicated: Secondary | ICD-10-CM | POA: Insufficient documentation

## 2019-02-22 DIAGNOSIS — X500XXA Overexertion from strenuous movement or load, initial encounter: Secondary | ICD-10-CM | POA: Insufficient documentation

## 2019-02-22 MED ORDER — HYDROCODONE-ACETAMINOPHEN 7.5-325 MG PO TABS: 1.0000 | ORAL_TABLET | Freq: Four times a day (QID) | ORAL | 0 refills | Status: DC | PRN

## 2019-02-22 MED ORDER — CYCLOBENZAPRINE HCL 5 MG PO TABS: 5.0000 mg | ORAL_TABLET | Freq: Three times a day (TID) | ORAL | 0 refills | Status: DC | PRN

## 2019-02-22 MED ORDER — KETOROLAC TROMETHAMINE 30 MG/ML IJ SOLN
30.0000 mg | Freq: Once | INTRAMUSCULAR | Status: AC
  Administered 2019-02-22: 30 mg via INTRAMUSCULAR
  Filled 2019-02-22: qty 1

## 2019-02-22 NOTE — Discharge Instructions (Addendum)
Follow-up with your primary care provider or can no clinic acute care if any continued problems.  Return to the emergency department if any severe worsening of your symptoms or loss of bowel and bladder control.  Begin taking Flexeril 3 times daily as prescribed.  Be aware that this medication along with the pain medication may cause drowsiness and increase your risk for falling or injury.  The pain medication is to be taken only when you are not driving.  You may also use ice or heat to your back as needed.

## 2019-02-22 NOTE — ED Triage Notes (Signed)
Pt presents to ED c/o lower back pain radiating into both legs and groin. Denies numbness, denies urinary problems. Pt states he has had problems with back before but not this painful. Pain is worse with movement.

## 2019-02-22 NOTE — ED Notes (Signed)
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

## 2019-02-22 NOTE — ED Provider Notes (Signed)
[facility] Emergency Department Provider Note  ____________________________________________   First MD Initiated Contact with Patient 02/22/19 1304     (approximate)  I have reviewed the triage vital signs and the nursing notes.   HISTORY  Chief Complaint Back Pain   HPI Stanley Leach is a 48 y.o. male presents to the ED with complaint of low back pain.  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  He denies any direct trauma or fall while working.  He states that he has had problems with his back in the past and late evening began having some muscle discomfort.  He states that he woke this morning with increased pain with some sciatic type pain radiating into his left upper leg.  Patient has continued to ambulate however this is been slow and with a limp.  Patient denies saddle anesthesias, incontinence of bowel or bladder.  Pain is improved with lying down.  Currently rates pain as a 2/10.  Patient did drive himself to the emergency department.     History reviewed. No pertinent past medical history.  There are no active problems to display for this patient.   History reviewed. No pertinent surgical history.  Prior to Admission medications   Medication Sig Start Date End Date Taking? Authorizing Provider  cyclobenzaprine (FLEXERIL) 5 MG tablet Take 1 tablet (5 mg total) by mouth 3 (three) times daily as needed for muscle spasms. 02/22/19   Tommi RumpsSummers,  L, PA-C  HYDROcodone-acetaminophen (NORCO) 7.5-325 MG tablet Take 1 tablet by mouth every 6 (six) hours as needed for moderate pain. 02/22/19   Tommi RumpsSummers,  L, PA-C    Allergies Patient has no known allergies.  History reviewed. No pertinent family history.  Social History Social History   Tobacco Use  . Smoking status: Heavy Tobacco Smoker    Packs/day: 0.50  . Smokeless tobacco: Never Used  Substance Use Topics  . Alcohol use: Yes    Comment:  occasionally  . Drug use: No    Review of Systems Constitutional: No fever/chills Cardiovascular: Denies chest pain. Respiratory: Denies shortness of breath. Gastrointestinal: No abdominal pain.  No nausea, no vomiting.  Genitourinary: Negative for dysuria. Musculoskeletal: Positive for bilateral low back pain. Skin: Negative for rash. Neurological: Negative for headaches, focal weakness or numbness. ___________________________________________   PHYSICAL EXAM:  VITAL SIGNS: ED Triage Vitals  Enc Vitals Group     BP 02/22/19 1235 136/79     Pulse Rate 02/22/19 1235 79     Resp 02/22/19 1235 18     Temp 02/22/19 1235 98.3 F (36.8 C)     Temp Source 02/22/19 1235 Oral     SpO2 02/22/19 1235 97 %     Weight 02/22/19 1234 240 lb (108.9 kg)     Height 02/22/19 1234 6' (1.829 m)     Head Circumference --      Peak Flow --      Pain Score 02/22/19 1244 2     Pain Loc --      Pain Edu? --      Excl. in GC? --    Constitutional: Alert and oriented. Well appearing and in no acute distress. Eyes: Conjunctivae are normal. Head: Atraumatic. Neck: No stridor.   Cardiovascular: Normal rate, regular rhythm. Grossly normal heart sounds.  Good peripheral circulation. Respiratory: Normal respiratory effort.  No retractions. Lungs CTAB. Gastrointestinal: Soft and nontender. No distention.  Bowel sounds normoactive x4 quadrants. Musculoskeletal: On  exam there is tenderness on palpation of lower lumbar L5-S1 area bilaterally.  Range of motion is restricted secondary to discomfort and increased pain.  Patient is able to move lower extremities and has good muscle strength.  Straight leg raises were approximately 15 degrees with pain bilaterally.  Patient is ambulatory slowly without assistance. Neurologic:  Normal speech and language. No gross focal neurologic deficits are appreciated. No gait instability. Skin:  Skin is warm, dry and intact.  No discoloration noted. Psychiatric: Mood and  affect are normal. Speech and behavior are normal.  ____________________________________________   LABS (all labs ordered are listed, but only abnormal results are displayed)  Labs Reviewed - No data to display  PROCEDURES  Procedure(s) performed (including Critical Care):  Procedures   ____________________________________________   INITIAL IMPRESSION / ASSESSMENT AND PLAN / ED COURSE  As part of my medical decision making, I reviewed the following data within the electronic MEDICAL RECORD NUMBER Notes from prior ED visits and [place] Controlled Substance Database  48 year old male presents to the ED with complaint of back pain.  Patient states he has had problems with his back in the past and is also had sciatica.  Yesterday he was doing some tree work and was very sore at the time he went to bed however this morning there is muscle spasms along with increased pain and some left leg radiculopathy.  Patient did drive to the ED and has no driver.  He was given Toradol 30 mg IM.  He was also given a prescription for Norco 7.5/325 1 every 6 hours as needed for pain and Flexeril 5 mg 1tab 3 times daily.  He is encouraged to use ice or heat to his back as needed.  He is aware that he cannot drive or operate machinery while taking these medications.  He will follow-up with his PCP if any continued problems.  He is also aware that he will need to return to the emergency department sooner if there is any worsening of his symptoms including incontinence of bowel or bladder.  ____________________________________________   FINAL CLINICAL IMPRESSION(S) / ED DIAGNOSES  Final diagnoses:  Strain of lumbar region, initial encounter  Back pain of lumbar region with sciatica     ED Discharge Orders         Ordered    HYDROcodone-acetaminophen (NORCO) 7.5-325 MG tablet  Every 6 hours PRN     02/22/19 1348    cyclobenzaprine (FLEXERIL) 5 MG tablet  3 times daily PRN     02/22/19 1348            Note:  This document was prepared using Dragon voice recognition software and may include unintentional dictation errors.    Tommi Rumps, PA-C 02/22/19 1521    Shaune Pollack, MD 02/22/19 2137

## 2019-04-18 ENCOUNTER — Emergency Department: Payer: Self-pay

## 2019-04-18 ENCOUNTER — Emergency Department
Admission: EM | Admit: 2019-04-18 | Discharge: 2019-04-18 | Disposition: A | Discharge status: Home / Self Care | Payer: Self-pay | Attending: Emergency Medicine | Admitting: Emergency Medicine

## 2019-04-18 ENCOUNTER — Other Ambulatory Visit: Payer: Self-pay

## 2019-04-18 ENCOUNTER — Encounter: Payer: Self-pay | Admitting: Emergency Medicine

## 2019-04-18 DIAGNOSIS — X509XXA Other and unspecified overexertion or strenuous movements or postures, initial encounter: Secondary | ICD-10-CM | POA: Insufficient documentation

## 2019-04-18 DIAGNOSIS — F17210 Nicotine dependence, cigarettes, uncomplicated: Secondary | ICD-10-CM | POA: Insufficient documentation

## 2019-04-18 DIAGNOSIS — M5441 Lumbago with sciatica, right side: Secondary | ICD-10-CM | POA: Insufficient documentation

## 2019-04-18 DIAGNOSIS — S39012A Strain of muscle, fascia and tendon of lower back, initial encounter: Secondary | ICD-10-CM | POA: Insufficient documentation

## 2019-04-18 DIAGNOSIS — Y998 Other external cause status: Secondary | ICD-10-CM | POA: Insufficient documentation

## 2019-04-18 DIAGNOSIS — M5442 Lumbago with sciatica, left side: Secondary | ICD-10-CM | POA: Insufficient documentation

## 2019-04-18 DIAGNOSIS — Y929 Unspecified place or not applicable: Secondary | ICD-10-CM | POA: Insufficient documentation

## 2019-04-18 DIAGNOSIS — Y9389 Activity, other specified: Secondary | ICD-10-CM | POA: Insufficient documentation

## 2019-04-18 MED ORDER — HYDROCODONE-ACETAMINOPHEN 5-325 MG PO TABS: 1.0000 | ORAL_TABLET | Freq: Four times a day (QID) | ORAL | 0 refills | Status: DC | PRN

## 2019-04-18 MED ORDER — PREDNISONE 10 MG PO TABS: ORAL_TABLET | ORAL | 0 refills | Status: DC

## 2019-04-18 MED ORDER — METHOCARBAMOL 500 MG PO TABS: ORAL_TABLET | ORAL | 0 refills | Status: DC

## 2019-04-18 MED ORDER — KETOROLAC TROMETHAMINE 30 MG/ML IJ SOLN
30.0000 mg | Freq: Once | INTRAMUSCULAR | Status: AC
  Administered 2019-04-18: 10:00:00 30 mg via INTRAMUSCULAR
  Filled 2019-04-18: qty 1

## 2019-04-18 NOTE — ED Notes (Signed)
See triage note  Presents with lower back pain  States he lifted something yesterday  Felt slight pian  This am pain has increased .. lower back which radiates into lower abd and both hips  Ambulates well

## 2019-04-18 NOTE — ED Provider Notes (Signed)
[facility] Emergency Department Provider Note  ____________________________________________   First MD Initiated Contact with Patient 04/18/19 [phone]     (approximate)  I have reviewed the triage vital signs and the nursing notes.   HISTORY  Chief Complaint Back Injury   HPI Stanley Leach is a 48 y.o. male presents to the ED with complaint of low back pain.  Patient states that he was lifting a beam yesterday and immediately felt some back discomfort.  He denies any direct trauma to his back.  He states that during the night he was stiff and sore and this morning when he woke it was increased.  Patient reports radiation from his low back into his lower extremities bilaterally.  Patient continues to ambulate without any assistance.  He denies any incontinence of bowel or bladder or saddle anesthesias.  Patient had a similar episode with his back last month which eventually got better.  He reports that he has never had his back x-ray.  Currently rates his pain as 5 out of 10.     History reviewed. No pertinent past medical history.  There are no active problems to display for this patient.   History reviewed. No pertinent surgical history.  Prior to Admission medications   Medication Sig Start Date End Date Taking? Authorizing Provider  HYDROcodone-acetaminophen (NORCO/VICODIN) 5-325 MG tablet Take 1 tablet by mouth every 6 (six) hours as needed for moderate pain. 04/18/19   Tommi RumpsSummers,  L, PA-C  methocarbamol (ROBAXIN) 500 MG tablet 1-2 every 6 hours as needed for muscle spasms. 04/18/19   Tommi RumpsSummers,  L, PA-C  predniSONE (DELTASONE) 10 MG tablet Take 6 tablets  today, on day 2 take 5 tablets, day 3 take 4 tablets, day 4 take 3 tablets, day 5 take  2 tablets and 1 tablet the last day 04/18/19   Tommi RumpsSummers,  L, PA-C    Allergies Patient has no known allergies.  No family history on file.  Social History Social History   Tobacco Use  .  Smoking status: Heavy Tobacco Smoker    Packs/day: 0.50  . Smokeless tobacco: Never Used  Substance Use Topics  . Alcohol use: Yes    Comment: occasionally  . Drug use: No    Review of Systems Constitutional: No fever/chills Cardiovascular: Denies chest pain. Respiratory: Denies shortness of breath. Gastrointestinal: No abdominal pain.  No nausea, no vomiting.  Genitourinary: Negative for dysuria. Musculoskeletal: Positive for low back pain with bilateral radiculopathy. Skin: Negative for rash. Neurological: Negative for  focal weakness or numbness. ____________________________________________   PHYSICAL EXAM:  VITAL SIGNS: ED Triage Vitals  Enc Vitals Group     BP 04/18/19 0800 (!) 141/95     Pulse Rate 04/18/19 0800 74     Resp 04/18/19 0800 16     Temp 04/18/19 0800 98.6 F (37 C)     Temp Source 04/18/19 0800 Oral     SpO2 04/18/19 0800 100 %     Weight 04/18/19 0759 240 lb 1.3 oz (108.9 kg)     Height 04/18/19 0759 6\' 1"  (1.854 m)     Head Circumference --      Peak Flow --      Pain Score 04/18/19 0758 5     Pain Loc --      Pain Edu? --      Excl. in GC? --    Constitutional: Alert and oriented. Well appearing and in no acute distress. Eyes: Conjunctivae are normal.  Head: Atraumatic. Neck: No stridor.   Cardiovascular: Normal rate, regular rhythm. Grossly normal heart sounds.  Good peripheral circulation. Respiratory: Normal respiratory effort.  No retractions. Lungs CTAB. Gastrointestinal: Soft and nontender. No distention.  Musculoskeletal: Examination of the back there is no gross deformity.  Range of motion is minimally restricted secondary to discomfort.  No point tenderness noted on palpation of the lumbar spine however he is moderately tender on palpation of the paravertebral muscles bilaterally at L5-S1 area.  Good muscle strength bilaterally.  Normal gait was noted. Neurologic:  Normal speech and language. No gross focal neurologic deficits are  appreciated. No gait instability. Skin:  Skin is warm, dry and intact. No rash noted. Psychiatric: Mood and affect are normal. Speech and behavior are normal.  ____________________________________________   LABS (all labs ordered are listed, but only abnormal results are displayed)  Labs Reviewed - No data to display  RADIOLOGY  Official radiology report(s): Dg Lumbar Spine 2-3 Views  Result Date: 04/18/2019 CLINICAL DATA:  Low back and bilateral lower extremity pain beginning yesterday. No known injury. EXAM: LUMBAR SPINE - 2-3 VIEW COMPARISON:  Plain films lumbar spine 01/07/2015. FINDINGS: There is no evidence of lumbar spine fracture. Alignment is normal. Intervertebral disc spaces are maintained. Scattered atherosclerotic calcifications noted. IMPRESSION: Negative lumbar spine. Atherosclerosis. Electronically Signed   By: Inge Rise M.D.   On: 04/18/2019 09:31    ____________________________________________   PROCEDURES  Procedure(s) performed (including Critical Care):  Procedures   ____________________________________________   INITIAL IMPRESSION / ASSESSMENT AND PLAN / ED COURSE  As part of my medical decision making, I reviewed the following data within the electronic MEDICAL RECORD NUMBER Notes from prior ED visits and [place] Controlled Substance Database  48 year old male presents to the ED with complaint of low back pain.  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  Patient was seen last month for an injury to his back as well which he states took 2 weeks for him to recuperate.  Patient today complains of some radicular type pains bilaterally into his lower extremities.  He denies any incontinence of bowel bladder.  X-rays were negative for any acute bony injury.  Patient was made aware that this most likely is more muscular in nature.  He was  given Toradol 30 mg IM.  He was discharged with a prescription for methocarbamol 500 mg 1 or 2 tablets every 6 hours as needed for muscle spasms, prednisone taper and Norco 1 every 6 hours if needed for moderate pain.  He is made aware that he cannot take this medication while working or driving.  He is to use ice or heat to his lower back if needed for discomfort and follow-up with the orthopedist listed on his discharge papers.  ____________________________________________   FINAL CLINICAL IMPRESSION(S) / ED DIAGNOSES  Final diagnoses:  Low back strain, initial encounter  Acute bilateral low back pain with bilateral sciatica     ED Discharge Orders         Ordered    predniSONE (DELTASONE) 10 MG tablet     04/18/19 1000    methocarbamol (ROBAXIN) 500 MG tablet     04/18/19 1000    HYDROcodone-acetaminophen (NORCO/VICODIN) 5-325 MG tablet  Every 6 hours PRN     04/18/19 1000           Note:  This document was prepared using Dragon voice recognition software and may include unintentional dictation errors.    Tommi RumpsSummers,  L, PA-C 04/18/19 1514    Minna AntisPaduchowski, Kevin, MD 04/19/19 1300

## 2019-04-18 NOTE — ED Triage Notes (Signed)
C/O back injury from lifting beams yesterday.  States awoke today feeling stiff.  AAOx3.  Skin warm and dry.

## 2019-04-18 NOTE — Discharge Instructions (Addendum)
Follow-up with Dr. Harlow Mares if any continued back pain in 5 to 7 days.  You will need to call the office to make an appointment and also tell them that you were seen in the ED and was referred to him.  Begin taking the methocarbamol 500 mg 1 or 2 tablets every 6 hours as needed for muscle spasms.  Be aware that this medication could cause drowsiness and increase your risk for injury.  Also the prednisone Dosepak is to be tapered down over the next 6 days.  Norco is for severe pain if needed.  Take only as directed.  Also this medication could cause drowsiness.  You may use ice or heat to your back as needed for comfort and to help control some of the pain.

## 2019-08-14 ENCOUNTER — Emergency Department: Payer: Self-pay

## 2019-08-14 ENCOUNTER — Other Ambulatory Visit: Payer: Self-pay

## 2019-08-14 ENCOUNTER — Encounter: Payer: Self-pay | Admitting: Emergency Medicine

## 2019-08-14 ENCOUNTER — Emergency Department
Admission: EM | Admit: 2019-08-14 | Discharge: 2019-08-14 | Disposition: A | Discharge status: Home / Self Care | Payer: Self-pay | Attending: Student in an Organized Health Care Education/Training Program | Admitting: Student in an Organized Health Care Education/Training Program

## 2019-08-14 DIAGNOSIS — F17210 Nicotine dependence, cigarettes, uncomplicated: Secondary | ICD-10-CM | POA: Insufficient documentation

## 2019-08-14 DIAGNOSIS — K5732 Diverticulitis of large intestine without perforation or abscess without bleeding: Secondary | ICD-10-CM | POA: Insufficient documentation

## 2019-08-14 DIAGNOSIS — Z79899 Other long term (current) drug therapy: Secondary | ICD-10-CM | POA: Insufficient documentation

## 2019-08-14 LAB — COMPREHENSIVE METABOLIC PANEL
ALT: 22 U/L (ref 0–44)
AST: 15 U/L (ref 15–41)
Albumin: 4.2 g/dL (ref 3.5–5.0)
Alkaline Phosphatase: 53 U/L (ref 38–126)
Anion gap: 10 (ref 5–15)
BUN: 11 mg/dL (ref 6–20)
CO2: 24 mmol/L (ref 22–32)
Calcium: 9.2 mg/dL (ref 8.9–10.3)
Chloride: 101 mmol/L (ref 98–111)
Creatinine, Ser: 0.95 mg/dL (ref 0.61–1.24)
GFR calc Af Amer: >60 mL/min (ref >60)
GFR calc non Af Amer: >60 mL/min (ref >60)
Glucose, Bld: 101 mg/dL — ABNORMAL HIGH (ref 70–99)
Potassium: 3.8 mmol/L (ref 3.5–5.1)
Sodium: 135 mmol/L (ref 135–145)
Total Bilirubin: 2 mg/dL — ABNORMAL HIGH (ref 0.3–1.2)
Total Protein: 7.7 g/dL (ref 6.5–8.1)

## 2019-08-14 LAB — URINALYSIS, COMPLETE (UACMP) WITH MICROSCOPIC
Bacteria, UA: NONE SEEN
Bilirubin Urine: NEGATIVE
Glucose, UA: NEGATIVE mg/dL
Hgb urine dipstick: NEGATIVE
Ketones, ur: NEGATIVE mg/dL
Nitrite: NEGATIVE
Protein, ur: NEGATIVE mg/dL
Specific Gravity, Urine: 1.023 (ref 1.005–1.030)
Squamous Epithelial / HPF: NONE SEEN (ref 0–5)
pH: 5 (ref 5.0–8.0)

## 2019-08-14 LAB — CBC
HCT: 45.4 % (ref 39.0–52.0)
Hemoglobin: 15.8 g/dL (ref 13.0–17.0)
MCH: 31.1 pg (ref 26.0–34.0)
MCHC: 34.8 g/dL (ref 30.0–36.0)
MCV: 89.4 fL (ref 80.0–100.0)
Platelets: 327 10*3/uL (ref 150–400)
RBC: 5.08 MIL/uL (ref 4.22–5.81)
RDW: 12.4 % (ref 11.5–15.5)
WBC: 15 10*3/uL — ABNORMAL HIGH (ref 4.0–10.5)
nRBC: 0 % (ref 0.0–0.2)

## 2019-08-14 LAB — LIPASE, BLOOD: Lipase: 24 U/L (ref 11–51)

## 2019-08-14 MED ORDER — METRONIDAZOLE 500 MG PO TABS
500.0000 mg | ORAL_TABLET | Freq: Once | ORAL | Status: AC
  Administered 2019-08-14: 500 mg via ORAL
  Filled 2019-08-14: qty 1

## 2019-08-14 MED ORDER — CIPROFLOXACIN HCL 500 MG PO TABS: 500.0000 mg | ORAL_TABLET | Freq: Two times a day (BID) | ORAL | 0 refills | Status: AC

## 2019-08-14 MED ORDER — IOHEXOL 300 MG/ML  SOLN
100.0000 mL | Freq: Once | INTRAMUSCULAR | Status: AC | PRN
  Administered 2019-08-14: 100 mL via INTRAVENOUS
  Filled 2019-08-14: qty 100

## 2019-08-14 MED ORDER — CIPROFLOXACIN HCL 500 MG PO TABS
500.0000 mg | ORAL_TABLET | Freq: Once | ORAL | Status: AC
  Administered 2019-08-14: 500 mg via ORAL
  Filled 2019-08-14: qty 1

## 2019-08-14 MED ORDER — ONDANSETRON HCL 4 MG PO TABS: 4.0000 mg | ORAL_TABLET | Freq: Every day | ORAL | 0 refills | Status: DC | PRN

## 2019-08-14 MED ORDER — METRONIDAZOLE 500 MG PO TABS: 500.0000 mg | ORAL_TABLET | Freq: Three times a day (TID) | ORAL | 0 refills | Status: AC

## 2019-08-14 MED ORDER — HYDROCODONE-ACETAMINOPHEN 5-325 MG PO TABS: 1.0000 | ORAL_TABLET | ORAL | 0 refills | Status: DC | PRN

## 2019-08-14 NOTE — Discharge Instructions (Addendum)

## 2019-08-14 NOTE — ED Triage Notes (Signed)
Pt reports pain to left upper abd since 1am. Pt reports painful to even touch it but denies injuries.

## 2019-08-14 NOTE — ED Provider Notes (Signed)
[facility] Emergency Department Provider Note    First MD Initiated Contact with Patient 08/14/19 1309     (approximate)  I have reviewed the triage vital signs and the nursing notes.   HISTORY  Chief Complaint Abdominal Pain    HPI Stanley Leach is a 48 y.o. male with the below listed past medical history presents to the ER for evaluation of acute onset left upper quadrant abdominal pain.  Does radiate to his left back.  Denies any dysuria.  No diarrhea or constipation.  No measured temperature but did have some chills.  No recent antibiotics.  Does have family history of diverticulitis diverticulosis.  No previous abdominal surgeries.  States the pain was moderate to severe but is since subsided and now only mild.    History reviewed. No pertinent past medical history. No family history on file. History reviewed. No pertinent surgical history. There are no active problems to display for this patient.     Prior to Admission medications   Medication Sig Start Date End Date Taking? Authorizing Provider  ciprofloxacin (CIPRO) 500 MG tablet Take 1 tablet (500 mg total) by mouth 2 (two) times daily for 10 days. 08/14/19 08/24/19  Willy Eddyobinson, Amariona Rathje, MD  HYDROcodone-acetaminophen (NORCO) 5-325 MG tablet Take 1 tablet by mouth every 4 (four) hours as needed for moderate pain. 08/14/19   Willy Eddyobinson, Marshal Schrecengost, MD  HYDROcodone-acetaminophen (NORCO/VICODIN) 5-325 MG tablet Take 1 tablet by mouth every 6 (six) hours as needed for moderate pain. 04/18/19   Tommi RumpsSummers, Rhonda L, PA-C  methocarbamol (ROBAXIN) 500 MG tablet 1-2 every 6 hours as needed for muscle spasms. 04/18/19   Tommi RumpsSummers, Rhonda L, PA-C  metroNIDAZOLE (FLAGYL) 500 MG tablet Take 1 tablet (500 mg total) by mouth 3 (three) times daily for 10 days. 08/14/19 08/24/19  Willy Eddyobinson, Kadi Hession, MD  ondansetron (ZOFRAN) 4 MG tablet Take 1 tablet (4 mg total) by mouth daily as needed. 08/14/19 08/13/20  Willy Eddyobinson,  Jairus Tonne, MD  predniSONE (DELTASONE) 10 MG tablet Take 6 tablets  today, on day 2 take 5 tablets, day 3 take 4 tablets, day 4 take 3 tablets, day 5 take  2 tablets and 1 tablet the last day 04/18/19   Tommi RumpsSummers, Rhonda L, PA-C    Allergies Patient has no known allergies.    Social History Social History   Tobacco Use  . Smoking status: Heavy Tobacco Smoker    Packs/day: 0.50  . Smokeless tobacco: Never Used  Substance Use Topics  . Alcohol use: Yes    Comment: occasionally  . Drug use: No    Review of Systems Patient denies headaches, rhinorrhea, blurry vision, numbness, shortness of breath, chest pain, edema, cough, abdominal pain, nausea, vomiting, diarrhea, dysuria, fevers, rashes or hallucinations unless otherwise stated above in HPI. ____________________________________________   PHYSICAL EXAM:  VITAL SIGNS: Vitals:   08/14/19 1140  BP: 138/90  Pulse: 87  Resp: 18  Temp: 98.2 F (36.8 C)  SpO2: 97%    Constitutional: Alert and oriented.  Eyes: Conjunctivae are normal.  Head: Atraumatic. Nose: No congestion/rhinnorhea. Mouth/Throat: Mucous membranes are moist.   Neck: No stridor. Painless ROM.  Cardiovascular: Normal rate, regular rhythm. Grossly normal heart sounds.  Good peripheral circulation. Respiratory: Normal respiratory effort.  No retractions. Lungs CTAB. Gastrointestinal: Soft with ttp in LUQ. No distention. No abdominal bruits. No CVA tenderness. Genitourinary:  Musculoskeletal: No lower extremity tenderness nor edema.  No joint effusions. Neurologic:  Normal speech and language. No gross focal neurologic deficits  are appreciated. No facial droop Skin:  Skin is warm, dry and intact. No rash noted. Psychiatric: Mood and affect are normal. Speech and behavior are normal.  ____________________________________________   LABS (all labs ordered are listed, but only abnormal results are displayed)  Results for orders placed or performed during the  hospital encounter of 08/14/19 (from the past 24 hour(s))  Lipase, blood     Status: None   Collection Time: 08/14/19 11:28 AM  Result Value Ref Range   Lipase 24 11 - 51 U/L  Comprehensive metabolic panel     Status: Abnormal   Collection Time: 08/14/19 11:28 AM  Result Value Ref Range   Sodium 135 135 - 145 mmol/L   Potassium 3.8 3.5 - 5.1 mmol/L   Chloride 101 98 - 111 mmol/L   CO2 24 22 - 32 mmol/L   Glucose, Bld 101 (H) 70 - 99 mg/dL   BUN 11 6 - 20 mg/dL   Creatinine, Ser 7.41 0.61 - 1.24 mg/dL   Calcium 9.2 8.9 - 28.7 mg/dL   Total Protein 7.7 6.5 - 8.1 g/dL   Albumin 4.2 3.5 - 5.0 g/dL   AST 15 15 - 41 U/L   ALT 22 0 - 44 U/L   Alkaline Phosphatase 53 38 - 126 U/L   Total Bilirubin 2.0 (H) 0.3 - 1.2 mg/dL   GFR calc non Af Amer >60 >60 mL/min   GFR calc Af Amer >60 >60 mL/min   Anion gap 10 5 - 15  CBC     Status: Abnormal   Collection Time: 08/14/19 11:28 AM  Result Value Ref Range   WBC 15.0 (H) 4.0 - 10.5 K/uL   RBC 5.08 4.22 - 5.81 MIL/uL   Hemoglobin 15.8 13.0 - 17.0 g/dL   HCT 86.7 67.2 - 09.4 %   MCV 89.4 80.0 - 100.0 fL   MCH 31.1 26.0 - 34.0 pg   MCHC 34.8 30.0 - 36.0 g/dL   RDW 70.9 62.8 - 36.6 %   Platelets 327 150 - 400 K/uL   nRBC 0.0 0.0 - 0.2 %  Urinalysis, Complete w Microscopic     Status: Abnormal   Collection Time: 08/14/19 11:28 AM  Result Value Ref Range   Color, Urine YELLOW (A) YELLOW   APPearance CLEAR (A) CLEAR   Specific Gravity, Urine 1.023 1.005 - 1.030   pH 5.0 5.0 - 8.0   Glucose, UA NEGATIVE NEGATIVE mg/dL   Hgb urine dipstick NEGATIVE NEGATIVE   Bilirubin Urine NEGATIVE NEGATIVE   Ketones, ur NEGATIVE NEGATIVE mg/dL   Protein, ur NEGATIVE NEGATIVE mg/dL   Nitrite NEGATIVE NEGATIVE   Leukocytes,Ua SMALL (A) NEGATIVE   RBC / HPF 0-5 0 - 5 RBC/hpf   WBC, UA 11-20 0 - 5 WBC/hpf   Bacteria, UA NONE SEEN NONE SEEN   Squamous Epithelial / LPF NONE SEEN 0 - 5   Mucus PRESENT    ____________________________________________  ____________________________________________  RADIOLOGY  I personally reviewed all radiographic images ordered to evaluate for the above acute complaints and reviewed radiology reports and findings.  These findings were personally discussed with the patient.  Please see medical record for radiology report.  ____________________________________________   PROCEDURES  Procedure(s) performed:  Procedures    Critical Care performed: no ____________________________________________   INITIAL IMPRESSION / ASSESSMENT AND PLAN / ED COURSE  Pertinent labs & imaging results that were available during my care of the patient were reviewed by me and considered in my medical decision making (  see chart for details).   DDX: Diverticulitis, abscess, perforation, colitis, hernia, musculoskeletal strain, shingles  KEMONI ORTEGA is a 48 y.o. who presents to the ED with symptoms as described above.  Patient nontoxic-appearing afebrile hemodynamically stable does have elevated white count and is tender left upper quadrant with history of diverticulosis.  Given amount of tenderness will order CT imaging to exclude perforation or abscess.   Clinical Course as of Aug 13 1416  Mon Aug 14, 2019  1416 CT imaging showed evidence of uncomplicated diverticulitis.  No abscess or perforation.  Patient tolerating p.o. remains hemodynamically stable and is appropriate for outpatient management.  Discussed signs and symptoms for which he should return to the ER.   [PR]    Clinical Course User Index [PR] Merlyn Lot, MD    The patient was evaluated in Emergency Department today for the symptoms described in the history of present illness. He/she was evaluated in the context of the global COVID-19 pandemic, which necessitated consideration that the patient might be at risk for infection with the SARS-CoV-2 virus that causes COVID-19. Institutional protocols and algorithms that pertain to the evaluation  of patients at risk for COVID-19 are in a state of rapid change based on information released by regulatory bodies including the CDC and federal and state organizations. These policies and algorithms were followed during the patient's care in the ED.  As part of my medical decision making, I reviewed the following data within the [place] notes reviewed and incorporated, Labs reviewed, notes from prior ED visits and  Controlled Substance Database   ____________________________________________   FINAL CLINICAL IMPRESSION(S) / ED DIAGNOSES  Final diagnoses:  Diverticulitis large intestine w/o perforation or abscess w/o bleeding      NEW MEDICATIONS STARTED DURING THIS VISIT:  New Prescriptions   CIPROFLOXACIN (CIPRO) 500 MG TABLET    Take 1 tablet (500 mg total) by mouth 2 (two) times daily for 10 days.   HYDROCODONE-ACETAMINOPHEN (NORCO) 5-325 MG TABLET    Take 1 tablet by mouth every 4 (four) hours as needed for moderate pain.   METRONIDAZOLE (FLAGYL) 500 MG TABLET    Take 1 tablet (500 mg total) by mouth 3 (three) times daily for 10 days.   ONDANSETRON (ZOFRAN) 4 MG TABLET    Take 1 tablet (4 mg total) by mouth daily as needed.     Note:  This document was prepared using Dragon voice recognition software and may include unintentional dictation errors.    Merlyn Lot, MD 08/14/19 [phone]

## 2019-08-14 NOTE — ED Notes (Signed)
Pt verbalizes understanding of d/c instructions, and follow up 

## 2019-09-25 ENCOUNTER — Encounter: Payer: Self-pay | Admitting: Emergency Medicine

## 2019-09-25 ENCOUNTER — Emergency Department
Admission: EM | Admit: 2019-09-25 | Discharge: 2019-09-25 | Disposition: A | Discharge status: Home / Self Care | Payer: HRSA Program | Attending: Emergency Medicine | Admitting: Emergency Medicine

## 2019-09-25 DIAGNOSIS — Z79899 Other long term (current) drug therapy: Secondary | ICD-10-CM | POA: Insufficient documentation

## 2019-09-25 DIAGNOSIS — J069 Acute upper respiratory infection, unspecified: Secondary | ICD-10-CM | POA: Insufficient documentation

## 2019-09-25 DIAGNOSIS — F17210 Nicotine dependence, cigarettes, uncomplicated: Secondary | ICD-10-CM | POA: Insufficient documentation

## 2019-09-25 DIAGNOSIS — R519 Headache, unspecified: Secondary | ICD-10-CM

## 2019-09-25 DIAGNOSIS — U071 COVID-19: Secondary | ICD-10-CM | POA: Insufficient documentation

## 2019-09-25 MED ORDER — IBUPROFEN 800 MG PO TABS
800.0000 mg | ORAL_TABLET | Freq: Once | ORAL | Status: AC
  Administered 2019-09-25: 800 mg via ORAL
  Filled 2019-09-25: qty 1

## 2019-09-25 NOTE — ED Provider Notes (Signed)
Emergency Department Provider Note  ____________________________________________  Time seen: Approximately 9:24 PM  I have reviewed the triage vital signs and the nursing notes.   HISTORY  Chief Complaint Headache   Historian Patient     HPI Stanley Leach is a 49 y.o. male presents to the emergency department with a dull 3 out of 10 headache that he characterizes as annoying for the past 3 days.  Patient also states that he has body aches.  He denies chest pain, chest tightness, chills, nonproductive cough or fatigue.  Denies a history of headaches.  No photophobia or phonophobia.  He does state that he has had some dizziness from sitting to standing.  No similar symptoms in the past.  No other alleviating measures have been attempted.   History reviewed. No pertinent past medical history.   Immunizations up to date:  Yes.     History reviewed. No pertinent past medical history.  There are no problems to display for this patient.   History reviewed. No pertinent surgical history.  Prior to Admission medications   Medication Sig Start Date End Date Taking? Authorizing Provider  HYDROcodone-acetaminophen (NORCO) 5-325 MG tablet Take 1 tablet by mouth every 4 (four) hours as needed for moderate pain. 08/14/19   Willy Eddy, MD  HYDROcodone-acetaminophen (NORCO/VICODIN) 5-325 MG tablet Take 1 tablet by mouth every 6 (six) hours as needed for moderate pain. 04/18/19   Tommi Rumps, PA-C  methocarbamol (ROBAXIN) 500 MG tablet 1-2 every 6 hours as needed for muscle spasms. 04/18/19   Tommi Rumps, PA-C  ondansetron (ZOFRAN) 4 MG tablet Take 1 tablet (4 mg total) by mouth daily as needed. 08/14/19 08/13/20  Willy Eddy, MD  predniSONE (DELTASONE) 10 MG tablet Take 6 tablets  today, on day 2 take 5 tablets, day 3 take 4 tablets, day 4 take 3 tablets, day 5 take  2 tablets and 1 tablet the last day 04/18/19   Tommi Rumps, PA-C     Allergies Patient has no known allergies.  History reviewed. No pertinent family history.  Social History Social History   Tobacco Use  . Smoking status: Heavy Tobacco Smoker    Packs/day: 0.50  . Smokeless tobacco: Never Used  Substance Use Topics  . Alcohol use: Yes    Comment: occasionally  . Drug use: No     Review of Systems  Constitutional: No fever/chills Eyes:  No discharge ENT: No upper respiratory complaints. Respiratory: no cough. No SOB/ use of accessory muscles to breath Gastrointestinal:   No nausea, no vomiting.  No diarrhea.  No constipation. Musculoskeletal: Patient has body aches.  Neuro: Patient has headache.  Skin: Negative for rash, abrasions, lacerations, ecchymosis.    ____________________________________________   PHYSICAL EXAM:  VITAL SIGNS: ED Triage Vitals [09/25/19 2010]  Enc Vitals Group     BP 121/76     Pulse Rate 94     Resp 18     Temp 99 F (37.2 C)     Temp Source Oral     SpO2 97 %     Weight      Height      Head Circumference      Peak Flow      Pain Score      Pain Loc      Pain Edu?      Excl. in GC?      Constitutional: Alert and oriented. Patient is lying supine. Eyes: Conjunctivae are normal. PERRL. EOMI. Head: Atraumatic.  ENT:      Ears: Tympanic membranes are mildly injected with mild effusion bilaterally.       Nose: No congestion/rhinnorhea.      Mouth/Throat: Mucous membranes are moist. Posterior pharynx is mildly erythematous.  Hematological/Lymphatic/Immunilogical: No cervical lymphadenopathy.  Cardiovascular: Normal rate, regular rhythm. Normal S1 and S2.  Good peripheral circulation. Respiratory: Normal respiratory effort without tachypnea or retractions. Lungs CTAB. Good air entry to the bases with no decreased or absent breath sounds. Gastrointestinal: Bowel sounds 4 quadrants. Soft and nontender to palpation. No guarding or rigidity. No palpable masses. No distention. No CVA  tenderness. Musculoskeletal: Full range of motion to all extremities. No gross deformities appreciated. Neurologic:  Normal speech and language. No gross focal neurologic deficits are appreciated.  Skin:  Skin is warm, dry and intact. No rash noted. Psychiatric: Mood and affect are normal. Speech and behavior are normal. Patient exhibits appropriate insight and judgement.    ____________________________________________   LABS (all labs ordered are listed, but only abnormal results are displayed)  Labs Reviewed  SARS CORONAVIRUS 2 (TAT 6-24 HRS)   ____________________________________________  EKG   ____________________________________________  RADIOLOGY  No results found.  ____________________________________________    PROCEDURES  Procedure(s) performed:     Procedures     Medications  ibuprofen (ADVIL) tablet 800 mg (has no administration in time range)     ____________________________________________   INITIAL IMPRESSION / ASSESSMENT AND PLAN / ED COURSE  Pertinent labs & imaging results that were available during my care of the patient were reviewed by me and considered in my medical decision making (see chart for details).      Assessment and Plan: Headache 49 year old male presents to the emergency department with a dull headache for the past 2 to 3 days as well as myalgias.  Neuro exam was reassuring and appropriate for age.  Patient denies blurry vision or subjective weakness in the upper and lower extremities.  Denies a history of hypertension.  Will test for COVID-19 and recommend increase hydration at home.  Patient was advised to return to the emergency department if headache worsens.  He voiced understanding and has easy access to the ED.  All patient questions were answered.     ____________________________________________  FINAL CLINICAL IMPRESSION(S) / ED DIAGNOSES  Final diagnoses:  Acute nonintractable headache, unspecified headache  type  Viral URI      NEW MEDICATIONS STARTED DURING THIS VISIT:  ED Discharge Orders    None          This chart was dictated using voice recognition software/Dragon. Despite best efforts to proofread, errors can occur which can change the meaning. Any change was purely unintentional.     Karren Cobble 09/25/19 2127    Blake Divine, MD 09/26/19 [phone]

## 2019-09-25 NOTE — ED Triage Notes (Signed)
Pt c/o cough, HA and general fatigue x 2 days. Pt denies SOB and fever.

## 2019-09-25 NOTE — ED Notes (Signed)
See triage note. Pt reports headache on top of head x3 days, dizziness for past several hours and muscle aches since this AM. Pt states he thinks he may be dehydrated.  Denies any known COVID exposure.  Pt in NAD at this time.

## 2019-09-25 NOTE — Discharge Instructions (Signed)
Increase hydration at home. Stay quarantine at home until COVID-19 results return.

## 2019-09-26 ENCOUNTER — Telehealth: Payer: Self-pay | Admitting: Medical Oncology

## 2019-09-26 LAB — SARS CORONAVIRUS 2 (TAT 6-24 HRS): SARS Coronavirus 2: POSITIVE — AB

## 2019-09-29 ENCOUNTER — Ambulatory Visit: Payer: Self-pay | Attending: Internal Medicine

## 2019-09-29 DIAGNOSIS — U071 COVID-19: Secondary | ICD-10-CM | POA: Insufficient documentation

## 2019-09-29 DIAGNOSIS — Z20822 Contact with and (suspected) exposure to covid-19: Secondary | ICD-10-CM

## 2019-10-01 LAB — NOVEL CORONAVIRUS, NAA: SARS-CoV-2, NAA: DETECTED — AB

## 2020-02-02 ENCOUNTER — Other Ambulatory Visit: Payer: Self-pay

## 2020-02-02 ENCOUNTER — Emergency Department
Admission: EM | Admit: 2020-02-02 | Discharge: 2020-02-02 | Disposition: A | Discharge status: Home / Self Care | Payer: Self-pay | Attending: Emergency Medicine | Admitting: Emergency Medicine

## 2020-02-02 DIAGNOSIS — Y9389 Activity, other specified: Secondary | ICD-10-CM | POA: Insufficient documentation

## 2020-02-02 DIAGNOSIS — F17210 Nicotine dependence, cigarettes, uncomplicated: Secondary | ICD-10-CM | POA: Insufficient documentation

## 2020-02-02 DIAGNOSIS — S39012A Strain of muscle, fascia and tendon of lower back, initial encounter: Secondary | ICD-10-CM | POA: Insufficient documentation

## 2020-02-02 DIAGNOSIS — Y990 Civilian activity done for income or pay: Secondary | ICD-10-CM | POA: Insufficient documentation

## 2020-02-02 DIAGNOSIS — X509XXA Other and unspecified overexertion or strenuous movements or postures, initial encounter: Secondary | ICD-10-CM | POA: Insufficient documentation

## 2020-02-02 DIAGNOSIS — M5432 Sciatica, left side: Secondary | ICD-10-CM | POA: Insufficient documentation

## 2020-02-02 DIAGNOSIS — Y929 Unspecified place or not applicable: Secondary | ICD-10-CM | POA: Insufficient documentation

## 2020-02-02 MED ORDER — NAPROXEN 500 MG PO TABS: 500.0000 mg | ORAL_TABLET | Freq: Two times a day (BID) | ORAL | 0 refills | Status: DC

## 2020-02-02 MED ORDER — METHOCARBAMOL 500 MG PO TABS: ORAL_TABLET | ORAL | 0 refills | Status: DC

## 2020-02-02 MED ORDER — HYDROCODONE-ACETAMINOPHEN 5-325 MG PO TABS: 1.0000 | ORAL_TABLET | Freq: Four times a day (QID) | ORAL | 0 refills | Status: DC | PRN

## 2020-02-02 MED ORDER — KETOROLAC TROMETHAMINE 30 MG/ML IJ SOLN
30.0000 mg | Freq: Once | INTRAMUSCULAR | Status: AC
  Administered 2020-02-02: 30 mg via INTRAMUSCULAR
  Filled 2020-02-02: qty 1

## 2020-02-02 NOTE — Discharge Instructions (Signed)
Follow-up with your primary care provider or [facility] acute care if any continued problems.  Moist heat or ice to your back as needed for discomfort.  Begin taking medication as directed at home.  The hydrocodone and the methocarbamol should not be taken while you are driving or operating machinery.  Naproxen should also help with inflammation and pain.

## 2020-02-02 NOTE — ED Notes (Signed)
Picked up a bucket t his am and now low back pain radiating down left leg.  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

## 2020-02-02 NOTE — ED Provider Notes (Signed)
[facility] Emergency Department Provider Note   ____________________________________________   First MD Initiated Contact with Patient 02/02/20 1128     (approximate)  I have reviewed the triage vital signs and the nursing notes.   HISTORY  Chief Complaint Back Pain   HPI Stanley Leach is a 49 y.o. male presents to the ED with complaint of left-sided low back pain with radiation down his left leg that is intermittent.  Patient states that he picked up a bucket this morning while at work and immediately had pain in his back.  He states he has had chronic problems with his back in the past.  He also does not want to file Workmen's Comp. for this injury.  He denies any incontinence of bowel or bladder.  He rates pain as 6 out of 10.      History reviewed. No pertinent past medical history.  There are no problems to display for this patient.   History reviewed. No pertinent surgical history.  Prior to Admission medications   Medication Sig Start Date End Date Taking? Authorizing Provider  HYDROcodone-acetaminophen (NORCO/VICODIN) 5-325 MG tablet Take 1 tablet by mouth every 6 (six) hours as needed. 02/02/20   Johnn Hai, PA-C  methocarbamol (ROBAXIN) 500 MG tablet 1 or 2 tablets every 6 hours as needed for muscle spasms 02/02/20   Johnn Hai, PA-C  naproxen (NAPROSYN) 500 MG tablet Take 1 tablet (500 mg total) by mouth 2 (two) times daily with a meal. 02/02/20   Johnn Hai, PA-C    Allergies Patient has no known allergies.  No family history on file.  Social History Social History   Tobacco Use  . Smoking status: Heavy Tobacco Smoker    Packs/day: 0.50  . Smokeless tobacco: Never Used  Substance Use Topics  . Alcohol use: Yes  . Drug use: No    Review of Systems  Constitutional: No fever/chills Eyes: No visual changes. Cardiovascular: Denies chest pain. Respiratory: Denies shortness of  breath. Musculoskeletal: Positive for low back pain. Skin: Negative for rash. Neurological: Negative for  focal weakness or numbness. ____________________________________________   PHYSICAL EXAM:  VITAL SIGNS: ED Triage Vitals  Enc Vitals Group     BP 02/02/20 1114 133/66     Pulse Rate 02/02/20 1113 90     Resp 02/02/20 1113 16     Temp 02/02/20 1113 98.2 F (36.8 C)     Temp Source 02/02/20 1113 Oral     SpO2 02/02/20 1113 96 %     Weight 02/02/20 1113 240 lb (108.9 kg)     Height 02/02/20 1113 6' (1.829 m)     Head Circumference --      Peak Flow --      Pain Score 02/02/20 1113 6     Pain Loc --      Pain Edu? --      Excl. in [place]? --     Constitutional: Alert and oriented. Well appearing and in no acute distress. Eyes: Conjunctivae are normal.  Head: Atraumatic. Neck: No stridor.   Cardiovascular: Normal rate, regular rhythm. Grossly normal heart sounds.  Good peripheral circulation. Respiratory: Normal respiratory effort.  No retractions. Lungs CTAB. Musculoskeletal: On examination of the back there is no gross deformity and no point tenderness on palpation of the lumbosacral spine.  No step-offs were noted.  There is moderate tenderness on palpation of the left SI joint and surrounding muscle tissue.  Range of motion is  guarded secondary to pain.  Good muscle strength bilaterally and patient is able to stand and ambulate without any assistance. Neurologic:  Normal speech and language. No gross focal neurologic deficits are appreciated.  Skin:  Skin is warm, dry and intact. No rash noted. Psychiatric: Mood and affect are normal. Speech and behavior are normal.  ____________________________________________   LABS (all labs ordered are listed, but only abnormal results are displayed)  Labs Reviewed - No data to display ____________________________________________  PROCEDURES  Procedure(s) performed (including Critical  Care):  Procedures   ____________________________________________   INITIAL IMPRESSION / ASSESSMENT AND PLAN / ED COURSE  As part of my medical decision making, I reviewed the following data within the electronic MEDICAL RECORD NUMBER Notes from prior ED visits and [place] Controlled Substance Database  49 year old male presents to the ED with left-sided low back pain with radiation to his left leg after picking up the pocket at work today.  He states he has had problems with his back in the past.  He denies any direct trauma to his back.  On exam he is moderately tender to palpation left SI joint area.  Muscle strength was good.  Patient is able to ambulate without any assistance.  Toradol 30 mg IM was given to the patient while in the ED.  He was sent a prescription for Norco as needed for pain, methocarbamol as needed for muscle spasms and naproxen for inflammation.  He is encouraged to use ice or heat to his back.  Patient was given a note to remain out of work the rest of the weekend.  ____________________________________________   FINAL CLINICAL IMPRESSION(S) / ED DIAGNOSES  Final diagnoses:  Lumbosacral strain, initial encounter  Sciatica of left side     ED Discharge Orders         Ordered    methocarbamol (ROBAXIN) 500 MG tablet     02/02/20 1224    HYDROcodone-acetaminophen (NORCO/VICODIN) 5-325 MG tablet  Every 6 hours PRN     02/02/20 1224    naproxen (NAPROSYN) 500 MG tablet  2 times daily with meals     02/02/20 1226           Note:  This document was prepared using Dragon voice recognition software and may include unintentional dictation errors.    Tommi Rumps, PA-C 02/02/20 1300    Sharman Cheek, MD 02/05/20 2051

## 2020-02-02 NOTE — ED Triage Notes (Signed)
Left lower back pain after picking up something at work today, states that this will not be workers comp.

## 2020-10-01 ENCOUNTER — Emergency Department
Admission: EM | Admit: 2020-10-01 | Discharge: 2020-10-01 | Disposition: A | Discharge status: Home / Self Care | Payer: Self-pay | Attending: Emergency Medicine | Admitting: Emergency Medicine

## 2020-10-01 ENCOUNTER — Encounter: Payer: Self-pay | Admitting: Emergency Medicine

## 2020-10-01 ENCOUNTER — Other Ambulatory Visit: Payer: Self-pay

## 2020-10-01 DIAGNOSIS — F17200 Nicotine dependence, unspecified, uncomplicated: Secondary | ICD-10-CM | POA: Insufficient documentation

## 2020-10-01 DIAGNOSIS — W260XXA Contact with knife, initial encounter: Secondary | ICD-10-CM | POA: Insufficient documentation

## 2020-10-01 DIAGNOSIS — S61210A Laceration without foreign body of right index finger without damage to nail, initial encounter: Secondary | ICD-10-CM | POA: Insufficient documentation

## 2020-10-01 MED ORDER — LIDOCAINE HCL (PF) 1 % IJ SOLN
5.0000 mL | Freq: Once | INTRAMUSCULAR | Status: DC
  Filled 2020-10-01: qty 5

## 2020-10-01 NOTE — Discharge Instructions (Signed)
The wound clean, dry, and covered.  Avoid lotions, creams, oils, and ointments over the glue.

## 2020-10-01 NOTE — ED Triage Notes (Signed)
Right index finger laceration, cut with pocket knife.  Bleeding controlled.  DSD applied.

## 2020-10-01 NOTE — ED Provider Notes (Signed)
[facility] Emergency Department Provider Note ____________________________________________  Time seen: 1425  I have reviewed the triage vital signs and the nursing notes.  HISTORY  Chief Complaint  Laceration  HPI Stanley Leach is a 50 y.o. male presents himself to the ED for evaluation of an accidental laceration to his right next finger.  Patient describes excellently cutting across the tip of his finger with his serrated pocket knife.  He denies any other injury at this time.  He reports a current tetanus status.  Patient describes normal sensation and range of motion to the distal finger.   History reviewed. No pertinent past medical history.  There are no problems to display for this patient.   History reviewed. No pertinent surgical history.  Prior to Admission medications   Medication Sig Start Date End Date Taking? Authorizing Provider  HYDROcodone-acetaminophen (NORCO/VICODIN) 5-325 MG tablet Take 1 tablet by mouth every 6 (six) hours as needed. 02/02/20   Tommi Rumps, PA-C  methocarbamol (ROBAXIN) 500 MG tablet 1 or 2 tablets every 6 hours as needed for muscle spasms 02/02/20   Tommi Rumps, PA-C  naproxen (NAPROSYN) 500 MG tablet Take 1 tablet (500 mg total) by mouth 2 (two) times daily with a meal. 02/02/20   Tommi Rumps, PA-C    Allergies Patient has no known allergies.  No family history on file.  Social History Social History   Tobacco Use  . Smoking status: Heavy Tobacco Smoker    Packs/day: 0.50  . Smokeless tobacco: Never Used  Substance Use Topics  . Alcohol use: Yes  . Drug use: No    Review of Systems  Constitutional: Negative for fever. Cardiovascular: Negative for chest pain. Respiratory: Negative for shortness of breath. Gastrointestinal: Negative for abdominal pain, vomiting and diarrhea. Genitourinary: Negative for dysuria. Musculoskeletal: Negative for back pain. Skin: Negative for rash.   Right index finger laceration as above. Neurological: Negative for headaches, focal weakness or numbness. ____________________________________________  PHYSICAL EXAM:  VITAL SIGNS: ED Triage Vitals  Enc Vitals Group     BP 10/01/20 1347 132/85     Pulse Rate 10/01/20 1347 (!) 106     Resp 10/01/20 1347 18     Temp 10/01/20 1347 98.9 F (37.2 C)     Temp Source 10/01/20 1528 Oral     SpO2 10/01/20 1347 100 %     Weight 10/01/20 1341 240 lb 1.3 oz (108.9 kg)     Height 10/01/20 1341 6' (1.829 m)     Head Circumference --      Peak Flow --      Pain Score 10/01/20 1341 4     Pain Loc --      Pain Edu? --      Excl. in [place]? --     Constitutional: Alert and oriented. Well appearing and in no distress. Head: Normocephalic and atraumatic. Eyes: Conjunctivae are normal. Normal extraocular movements Cardiovascular: Normal rate, regular rhythm. Normal distal pulses. Respiratory: Normal respiratory effort. No wheezes/rales/rhonchi. Musculoskeletal: Composite fist on the right hand.  Patient with a linear laceration in a horizontal line across the distal fat pad near the DIP.  Nontender with normal range of motion in all extremities.  Neurologic:  Normal gross sensation.  Normal speech and language. No gross focal neurologic deficits are appreciated. Skin:  Skin is warm, dry and intact. No rash noted. ____________________________________________  PROCEDURES  .Marland KitchenLaceration Repair  Date/Time: 10/01/2020 2:43 PM Performed by: Lissa Hoard, PA-C  Authorized by: Lissa Hoard, PA-C   Consent:    Consent obtained:  Verbal   Consent given by:  Patient   Risks, benefits, and alternatives were discussed: yes     Risks discussed:  Pain and poor wound healing Universal protocol:    Procedure explained and questions answered to patient or proxy's satisfaction: yes     Site/side marked: yes     Patient identity confirmed:  Verbally with patient Anesthesia:    Anesthesia  method:  None Laceration details:    Location:  Finger   Finger location:  R index finger   Length (cm):  1   Depth (mm):  2 Pre-procedure details:    Preparation:  Patient was prepped and draped in usual sterile fashion Exploration:    Contaminated: no   Treatment:    Area cleansed with:  Saline and soap and water   Amount of cleaning:  Standard   Irrigation solution:  Tap water   Debridement:  None   Undermining:  None   Scar revision: no   Skin repair:    Repair method:  Tissue adhesive Approximation:    Approximation:  Close Repair type:    Repair type:  Simple Post-procedure details:    Dressing:  Non-adherent dressing and splint for protection   Procedure completion:  Tolerated well, no immediate complications   ____________________________________________  INITIAL IMPRESSION / ASSESSMENT AND PLAN / ED COURSE  Patient with ED evaluation and management of a simple laceration to the right index finger.  The wound was repaired with Dermabond skin adhesive.  Patient is discharged wound care instructions and a finger splint for protection.  Follow-up with primary provider for ongoing symptoms.   Stanley Leach was evaluated in Emergency Department on 10/01/2020 for the symptoms described in the history of present illness. He was evaluated in the context of the global COVID-19 pandemic, which necessitated consideration that the patient might be at risk for infection with the SARS-CoV-2 virus that causes COVID-19. Institutional protocols and algorithms that pertain to the evaluation of patients at risk for COVID-19 are in a state of rapid change based on information released by regulatory bodies including the CDC and federal and state organizations. These policies and algorithms were followed during the patient's care in the ED. ____________________________________________  FINAL CLINICAL IMPRESSION(S) / ED DIAGNOSES  Final diagnoses:  Laceration of right index finger  without foreign body without damage to nail, initial encounter      Lissa Hoard, PA-C 10/01/20 1534    Minna Antis, MD 10/01/20 2113

## 2020-10-21 ENCOUNTER — Emergency Department
Admission: EM | Admit: 2020-10-21 | Discharge: 2020-10-21 | Disposition: A | Discharge status: Home / Self Care | Payer: Self-pay | Attending: Emergency Medicine | Admitting: Emergency Medicine

## 2020-10-21 ENCOUNTER — Other Ambulatory Visit: Payer: Self-pay

## 2020-10-21 ENCOUNTER — Emergency Department: Payer: Self-pay

## 2020-10-21 DIAGNOSIS — F17200 Nicotine dependence, unspecified, uncomplicated: Secondary | ICD-10-CM | POA: Insufficient documentation

## 2020-10-21 DIAGNOSIS — R1032 Left lower quadrant pain: Secondary | ICD-10-CM | POA: Insufficient documentation

## 2020-10-21 LAB — CBC
HCT: 47 % (ref 39.0–52.0)
Hemoglobin: 15.6 g/dL (ref 13.0–17.0)
MCH: 30.4 pg (ref 26.0–34.0)
MCHC: 33.2 g/dL (ref 30.0–36.0)
MCV: 91.6 fL (ref 80.0–100.0)
Platelets: 375 10*3/uL (ref 150–400)
RBC: 5.13 MIL/uL (ref 4.22–5.81)
RDW: 12.8 % (ref 11.5–15.5)
WBC: 9.5 10*3/uL (ref 4.0–10.5)
nRBC: 0 % (ref 0.0–0.2)

## 2020-10-21 LAB — URINALYSIS, COMPLETE (UACMP) WITH MICROSCOPIC
Bacteria, UA: NONE SEEN
Bilirubin Urine: NEGATIVE
Glucose, UA: NEGATIVE mg/dL
Hgb urine dipstick: NEGATIVE
Ketones, ur: NEGATIVE mg/dL
Nitrite: NEGATIVE
Protein, ur: NEGATIVE mg/dL
Specific Gravity, Urine: 1.017 (ref 1.005–1.030)
Squamous Epithelial / HPF: NONE SEEN (ref 0–5)
pH: 5 (ref 5.0–8.0)

## 2020-10-21 LAB — COMPREHENSIVE METABOLIC PANEL
ALT: 20 U/L (ref 0–44)
AST: 18 U/L (ref 15–41)
Albumin: 4 g/dL (ref 3.5–5.0)
Alkaline Phosphatase: 53 U/L (ref 38–126)
Anion gap: 9 (ref 5–15)
BUN: 10 mg/dL (ref 6–20)
CO2: 25 mmol/L (ref 22–32)
Calcium: 9.1 mg/dL (ref 8.9–10.3)
Chloride: 102 mmol/L (ref 98–111)
Creatinine, Ser: 0.93 mg/dL (ref 0.61–1.24)
GFR, Estimated: >60 mL/min (ref >60)
Glucose, Bld: 103 mg/dL — ABNORMAL HIGH (ref 70–99)
Potassium: 3.9 mmol/L (ref 3.5–5.1)
Sodium: 136 mmol/L (ref 135–145)
Total Bilirubin: 1.2 mg/dL (ref 0.3–1.2)
Total Protein: 7.5 g/dL (ref 6.5–8.1)

## 2020-10-21 LAB — LIPASE, BLOOD: Lipase: 29 U/L (ref 11–51)

## 2020-10-21 MED ORDER — AMOXICILLIN-POT CLAVULANATE 875-125 MG PO TABS: 1.0000 | ORAL_TABLET | Freq: Two times a day (BID) | ORAL | 0 refills | Status: AC

## 2020-10-21 MED ORDER — ONDANSETRON 4 MG PO TBDP
4.0000 mg | ORAL_TABLET | Freq: Once | ORAL | Status: AC
  Administered 2020-10-21: 4 mg via ORAL
  Filled 2020-10-21: qty 1

## 2020-10-21 MED ORDER — TRAMADOL HCL 50 MG PO TABS: 50.0000 mg | ORAL_TABLET | Freq: Four times a day (QID) | ORAL | 0 refills | Status: DC | PRN

## 2020-10-21 MED ORDER — KETOROLAC TROMETHAMINE 30 MG/ML IJ SOLN
30.0000 mg | Freq: Once | INTRAMUSCULAR | Status: AC
  Administered 2020-10-21: 30 mg via INTRAMUSCULAR
  Filled 2020-10-21: qty 1

## 2020-10-21 NOTE — ED Triage Notes (Signed)
Reports LLQ abdominal pain that started today with nausea. denies diarrhea or emesis. Pt alert and oriented X4, cooperative, RR even and unlabored, color WNL. Pt in NAD.

## 2020-10-21 NOTE — ED Provider Notes (Signed)
[facility] Emergency Department Provider Note   ____________________________________________    I have reviewed the triage vital signs and the nursing notes.   HISTORY  Chief Complaint Abdominal Pain     HPI Stanley Leach is a 50 y.o. male who presents with complaints of abdominal pain.  Patient describes left lower quadrant abdominal pain which started suddenly this morning .  Had been feeling well prior to this pain.  Has not take anything for this.  Has never had this pain before.  Denies dysuria.  No fevers or chills.  No history of abdominal surgery no history of kidney stones  History reviewed. No pertinent past medical history.  There are no problems to display for this patient.   History reviewed. No pertinent surgical history.  Prior to Admission medications   Medication Sig Start Date End Date Taking? Authorizing Provider  amoxicillin-clavulanate (AUGMENTIN) 875-125 MG tablet Take 1 tablet by mouth 2 (two) times daily for 7 days. 10/21/20 10/28/20 Yes Jene Every, MD  traMADol (ULTRAM) 50 MG tablet Take 1 tablet (50 mg total) by mouth every 6 (six) hours as needed. 10/21/20 10/21/21 Yes Jene Every, MD  HYDROcodone-acetaminophen (NORCO/VICODIN) 5-325 MG tablet Take 1 tablet by mouth every 6 (six) hours as needed. 02/02/20   Tommi Rumps, PA-C  methocarbamol (ROBAXIN) 500 MG tablet 1 or 2 tablets every 6 hours as needed for muscle spasms 02/02/20   Tommi Rumps, PA-C  naproxen (NAPROSYN) 500 MG tablet Take 1 tablet (500 mg total) by mouth 2 (two) times daily with a meal. 02/02/20   Tommi Rumps, PA-C     Allergies Patient has no known allergies.  No family history on file.  Social History Social History   Tobacco Use  . Smoking status: Heavy Tobacco Smoker    Packs/day: 0.50  . Smokeless tobacco: Never Used  Substance Use Topics  . Alcohol use: Yes  . Drug use: No    Review of Systems  Constitutional:  No fever/chills Eyes: No visual changes.  ENT: No sore throat. Cardiovascular: Denies chest pain. Respiratory: Denies shortness of breath. Gastrointestinal: As above Genitourinary: Negative for dysuria. Musculoskeletal: Negative for back pain. Skin: Negative for rash. Neurological: Negative for headaches   ____________________________________________   PHYSICAL EXAM:  VITAL SIGNS: ED Triage Vitals [10/21/20 1123]  Enc Vitals Group     BP 129/88     Pulse Rate 82     Resp 18     Temp 99.1 F (37.3 C)     Temp Source Oral     SpO2 98 %     Weight 113.4 kg (250 lb)     Height 1.854 m (6\' 1" )     Head Circumference      Peak Flow      Pain Score 5     Pain Loc      Pain Edu?      Excl. in GC?     Constitutional: Alert and oriented. No acute distress. Pleasant and interactive  Nose: No congestion/rhinnorhea. Mouth/Throat: Mucous membranes are moist.    Cardiovascular: Normal rate, regular rhythm. Grossly normal heart sounds.  Good peripheral circulation. Respiratory: Normal respiratory effort.  No retractions. Lungs CTAB. Gastrointestinal: Mild tenderness palpation left lower, soft. No distention.  No CVA tenderness.  Musculoskeletal: No lower extremity tenderness nor edema.  Warm and well perfused Neurologic:  Normal speech and language. No gross focal neurologic deficits are appreciated.  Skin:  Skin is warm,  dry and intact. No rash noted. Psychiatric: Mood and affect are normal. Speech and behavior are normal.  ____________________________________________   LABS (all labs ordered are listed, but only abnormal results are displayed)  Labs Reviewed  COMPREHENSIVE METABOLIC PANEL - Abnormal; Notable for the following components:      Result Value   Glucose, Bld 103 (*)    All other components within normal limits  URINALYSIS, COMPLETE (UACMP) WITH MICROSCOPIC - Abnormal; Notable for the following components:   Color, Urine YELLOW (*)    APPearance CLEAR (*)     Leukocytes,Ua TRACE (*)    All other components within normal limits  LIPASE, BLOOD  CBC   ____________________________________________  EKG   ____________________________________________  RADIOLOGY  CT renal stone study reviewed by me, no acute abnormalities noted ____________________________________________   PROCEDURES  Procedure(s) performed: No  Procedures   Critical Care performed: No ____________________________________________   INITIAL IMPRESSION / ASSESSMENT AND PLAN / ED COURSE  Pertinent labs & imaging results that were available during my care of the patient were reviewed by me and considered in my medical decision making (see chart for details).  Patient presents with left lower quadrant pain which started abruptly, suspicious for ureterolithiasis.  Pending urinalysis.  Lab work is overall reassuring.  Vital signs normal.  Mild tenderness palpation left lower quadrant.  Diverticulitis is also on the differential.  Doubt UTI  Will treat with IM Toradol, obtain CT renal stone study and urinalysis and reevaluate  CT scan is reassuring, urinalysis unremarkable.  Question early diverticulitis, will Rx Augmentin, analgesics, return precautions discussed    ____________________________________________   FINAL CLINICAL IMPRESSION(S) / ED DIAGNOSES  Final diagnoses:  Left lower quadrant abdominal pain        Note:  This document was prepared using Dragon voice recognition software and may include unintentional dictation errors.   Jene Every, MD 10/21/20 1500

## 2020-11-26 ENCOUNTER — Emergency Department: Payer: Self-pay

## 2020-11-26 ENCOUNTER — Encounter: Payer: Self-pay | Admitting: Emergency Medicine

## 2020-11-26 ENCOUNTER — Emergency Department
Admission: EM | Admit: 2020-11-26 | Discharge: 2020-11-26 | Disposition: A | Discharge status: Home / Self Care | Payer: Self-pay | Attending: Emergency Medicine | Admitting: Emergency Medicine

## 2020-11-26 ENCOUNTER — Other Ambulatory Visit: Payer: Self-pay

## 2020-11-26 DIAGNOSIS — M7732 Calcaneal spur, left foot: Secondary | ICD-10-CM | POA: Insufficient documentation

## 2020-11-26 DIAGNOSIS — F17210 Nicotine dependence, cigarettes, uncomplicated: Secondary | ICD-10-CM | POA: Insufficient documentation

## 2020-11-26 MED ORDER — NAPROXEN 500 MG PO TABS: 500.0000 mg | ORAL_TABLET | Freq: Two times a day (BID) | ORAL | Status: DC

## 2020-11-26 MED ORDER — LIDOCAINE 5 % EX PTCH
1.0000 | MEDICATED_PATCH | CUTANEOUS | Status: DC
  Administered 2020-11-26: 1 via TRANSDERMAL
  Filled 2020-11-26: qty 1

## 2020-11-26 MED ORDER — NAPROXEN 500 MG PO TABS
500.0000 mg | ORAL_TABLET | Freq: Once | ORAL | Status: AC
  Administered 2020-11-26: 500 mg via ORAL
  Filled 2020-11-26: qty 1

## 2020-11-26 NOTE — ED Triage Notes (Signed)
Pt in with pain to L heel, hx of heel spur. States he unevenly offloaded his foot off some steps today, and pain became severe. Able to ambulate with limp

## 2020-11-26 NOTE — Discharge Instructions (Addendum)
Follow-up with scheduled podiatry appointment.

## 2020-11-26 NOTE — ED Provider Notes (Signed)
[facility] Emergency Department Provider Note   ____________________________________________   Event Date/Time   First MD Initiated Contact with Patient 11/26/20 1405     (approximate)  I have reviewed the triage vital signs and the nursing notes.   HISTORY  Chief Complaint Foot Pain (Heel spur)    HPI Stanley Leach is a 50 y.o. male patient complain of pain to the plantar and posterior aspect of the left heel.  Patient said he stepped unevenly on some steps today.  Patient has a history of heel spur.  States pain is more pronounced in the posterior part of the heel at the insertion of the Achilles tendon.  Patient has full Nikkel range of motion.  Patient with increased pain with ambulation.  Rates pain is a 6/10.  Described pain as "achy".  No palliative measure for complaint.         History reviewed. No pertinent past medical history.  There are no problems to display for this patient.   History reviewed. No pertinent surgical history.  Prior to Admission medications   Medication Sig Start Date End Date Taking? Authorizing Provider  naproxen (NAPROSYN) 500 MG tablet Take 1 tablet (500 mg total) by mouth 2 (two) times daily with a meal. 11/26/20  Yes Joni Reining, PA-C  traMADol (ULTRAM) 50 MG tablet Take 1 tablet (50 mg total) by mouth every 6 (six) hours as needed. 10/21/20 10/21/21  Jene Every, MD    Allergies Patient has no known allergies.  No family history on file.  Social History Social History   Tobacco Use  . Smoking status: Heavy Tobacco Smoker    Packs/day: 0.50  . Smokeless tobacco: Never Used  Substance Use Topics  . Alcohol use: Yes  . Drug use: No    Review of Systems Constitutional: No fever/chills Eyes: No visual changes. ENT: No sore throat. Cardiovascular: Denies chest pain. Respiratory: Denies shortness of breath. Gastrointestinal: No abdominal pain.  No nausea, no vomiting.  No diarrhea.  No  constipation. Genitourinary: Negative for dysuria. Musculoskeletal: Left posterior ankle pain. Skin: Negative for rash. Neurological: Negative for headaches, focal weakness or numbness.   ____________________________________________   PHYSICAL EXAM:  VITAL SIGNS: ED Triage Vitals  Enc Vitals Group     BP 11/26/20 1323 (!) 136/95     Pulse Rate 11/26/20 1323 90     Resp 11/26/20 1323 18     Temp 11/26/20 1323 (!) 97.3 F (36.3 C)     Temp Source 11/26/20 1323 Oral     SpO2 11/26/20 1323 98 %     Weight 11/26/20 1323 250 lb (113.4 kg)     Height 11/26/20 1349 6\' 1"  (1.854 m)     Head Circumference --      Peak Flow --      Pain Score --      Pain Loc --      Pain Edu? --      Excl. in GC? --    Constitutional: Alert and oriented. Well appearing and in no acute distress. Cardiovascular: Normal rate, regular rhythm. Grossly normal heart sounds.  Good peripheral circulation. Respiratory: Normal respiratory effort.  No retractions. Lungs CTAB. Gastrointestinal: Soft and nontender. No distention. No abdominal bruits. No CVA tenderness. Genitourinary: Deferred Musculoskeletal: No obvious deformity to the left ankle.  Patient is moderate guarding at the insertion point of the Achilles tendon. Neurologic:  Normal speech and language. No gross focal neurologic deficits are appreciated. No gait  instability. Skin:  Skin is warm, dry and intact. No rash noted. Psychiatric: Mood and affect are normal. Speech and behavior are normal.  ____________________________________________   LABS (all labs ordered are listed, but only abnormal results are displayed)  Labs Reviewed - No data to display ____________________________________________  EKG   ____________________________________________  RADIOLOGY I, Joni Reining, personally viewed and evaluated these images (plain radiographs) as part of my medical decision making, as well as reviewing the written report by the  radiologist.  ED MD interpretation Achilles heel spur Official radiology report(s): DG Ankle Complete Left  Result Date: 11/26/2020 CLINICAL DATA:  Heel pain EXAM: LEFT ANKLE COMPLETE - 3+ VIEW COMPARISON:  Left foot 06/10/2017 FINDINGS: Normal ankle joint.  No fracture or soft tissue swelling. Calcaneal spurring at the Achilles tendon insertion unchanged from the prior study. IMPRESSION: Negative for fracture.  Calcaneal spurring unchanged. Electronically Signed   By: Marlan Palau M.D.   On: 11/26/2020 14:46    ____________________________________________   PROCEDURES  Procedure(s) performed (including Critical Care):  Procedures   ____________________________________________   INITIAL IMPRESSION / ASSESSMENT AND PLAN / ED COURSE  As part of my medical decision making, I reviewed the following data within the electronic MEDICAL RECORD NUMBER         Patient presents with posterior left ankle pain secondary to a twisting incident.  Discussed x-ray findings with patient consistent with Achilles heel spur.  Patient given discharge care instruction advised take medication as directed.  Patient advised follow-up with schedule podiatry appointment next week.      ____________________________________________   FINAL CLINICAL IMPRESSION(S) / ED DIAGNOSES  Final diagnoses:  Calcaneal spur of foot, left     ED Discharge Orders         Ordered    naproxen (NAPROSYN) 500 MG tablet  2 times daily with meals        11/26/20 1500          *Please note:  Stanley Leach was evaluated in Emergency Department on 11/26/2020 for the symptoms described in the history of present illness. He was evaluated in the context of the global COVID-19 pandemic, which necessitated consideration that the patient might be at risk for infection with the SARS-CoV-2 virus that causes COVID-19. Institutional protocols and algorithms that pertain to the evaluation of patients at risk for COVID-19 are  in a state of rapid change based on information released by regulatory bodies including the CDC and federal and state organizations. These policies and algorithms were followed during the patient's care in the ED.  Some ED evaluations and interventions may be delayed as a result of limited staffing during and the pandemic.*   Note:  This document was prepared using Dragon voice recognition software and may include unintentional dictation errors.    Joni Reining, PA-C 11/26/20 1509    Sharyn Creamer, MD 11/26/20 [phone]

## 2020-11-26 NOTE — ED Notes (Signed)
See triage note  States he has a heel spur  Stepped off step wrong  Pain became severe  Unable to bear full wt  Has an appt on Monday  States pain radiates up the back of heel

## 2021-02-03 ENCOUNTER — Encounter: Payer: Self-pay | Admitting: Emergency Medicine

## 2021-02-03 ENCOUNTER — Emergency Department: Payer: Self-pay

## 2021-02-03 ENCOUNTER — Other Ambulatory Visit: Payer: Self-pay

## 2021-02-03 ENCOUNTER — Emergency Department
Admission: EM | Admit: 2021-02-03 | Discharge: 2021-02-03 | Disposition: A | Discharge status: Home / Self Care | Payer: Self-pay | Attending: Emergency Medicine | Admitting: Emergency Medicine

## 2021-02-03 DIAGNOSIS — R0981 Nasal congestion: Secondary | ICD-10-CM | POA: Insufficient documentation

## 2021-02-03 DIAGNOSIS — M791 Myalgia, unspecified site: Secondary | ICD-10-CM | POA: Insufficient documentation

## 2021-02-03 DIAGNOSIS — R519 Headache, unspecified: Secondary | ICD-10-CM | POA: Insufficient documentation

## 2021-02-03 DIAGNOSIS — J9801 Acute bronchospasm: Secondary | ICD-10-CM | POA: Insufficient documentation

## 2021-02-03 DIAGNOSIS — Z28310 Unvaccinated for covid-19: Secondary | ICD-10-CM | POA: Insufficient documentation

## 2021-02-03 DIAGNOSIS — Z20822 Contact with and (suspected) exposure to covid-19: Secondary | ICD-10-CM | POA: Insufficient documentation

## 2021-02-03 DIAGNOSIS — F17200 Nicotine dependence, unspecified, uncomplicated: Secondary | ICD-10-CM | POA: Insufficient documentation

## 2021-02-03 LAB — RESP PANEL BY RT-PCR (FLU A&B, COVID) ARPGX2
Influenza A by PCR: NEGATIVE
Influenza B by PCR: NEGATIVE
SARS Coronavirus 2 by RT PCR: NEGATIVE

## 2021-02-03 MED ORDER — IPRATROPIUM-ALBUTEROL 0.5-2.5 (3) MG/3ML IN SOLN
3.0000 mL | Freq: Once | RESPIRATORY_TRACT | Status: AC
  Administered 2021-02-03: 3 mL via RESPIRATORY_TRACT
  Filled 2021-02-03: qty 3

## 2021-02-03 MED ORDER — METHYLPREDNISOLONE 4 MG PO TBPK: ORAL_TABLET | ORAL | 0 refills | Status: DC

## 2021-02-03 MED ORDER — FEXOFENADINE-PSEUDOEPHED ER 60-120 MG PO TB12: 1.0000 | ORAL_TABLET | Freq: Two times a day (BID) | ORAL | 0 refills | Status: DC

## 2021-02-03 MED ORDER — BENZONATATE 100 MG PO CAPS: 200.0000 mg | ORAL_CAPSULE | Freq: Three times a day (TID) | ORAL | 0 refills | Status: DC | PRN

## 2021-02-03 NOTE — Discharge Instructions (Signed)
Your chest x-ray was unremarkable.  You were negative for COVID-19 and influenza.  Read and follow discharge care instruction.  Take medication as directed.

## 2021-02-03 NOTE — ED Provider Notes (Signed)
[facility] Emergency Department Provider Note   ____________________________________________   Event Date/Time   First MD Initiated Contact with Patient 02/03/21 [phone]     (approximate)  I have reviewed the triage vital signs and the nursing notes.   HISTORY  Chief Complaint sinus congestion    HPI Stanley Leach is a 50 y.o. male patient presents with 4 days of increased sinus congestion, intermittent fever, headache, and body aches.  Patient states no known contact with COVID-19 and no recent travel.  Patient not been vaccinated for COVID-19 or taken flu shot.  Patient states COVID home test was negative yesterday.         History reviewed. No pertinent past medical history.  There are no problems to display for this patient.   History reviewed. No pertinent surgical history.  Prior to Admission medications   Medication Sig Start Date End Date Taking? Authorizing Provider  benzonatate (TESSALON PERLES) 100 MG capsule Take 2 capsules (200 mg total) by mouth 3 (three) times daily as needed. 02/03/21 02/03/22 Yes Joni Reining, PA-C  fexofenadine-pseudoephedrine (ALLEGRA-D) 60-120 MG 12 hr tablet Take 1 tablet by mouth 2 (two) times daily. 02/03/21  Yes Joni Reining, PA-C  methylPREDNISolone (MEDROL DOSEPAK) 4 MG TBPK tablet Take Tapered dose as directed 02/03/21  Yes Joni Reining, PA-C    Allergies Patient has no known allergies.  No family history on file.  Social History Social History   Tobacco Use  . Smoking status: Heavy Tobacco Smoker    Packs/day: 0.50  . Smokeless tobacco: Never Used  Substance Use Topics  . Alcohol use: Yes  . Drug use: No    Review of Systems Constitutional: Fever/chills and body aches.  Eyes: No visual changes. ENT: No sore throat.  Nasal congestion. Cardiovascular: Denies chest pain. Respiratory: Denies shortness of breath. Gastrointestinal: No abdominal pain.  No nausea, no vomiting.  No  diarrhea.  No constipation. Genitourinary: Negative for dysuria. Musculoskeletal: Negative for back pain. Skin: Negative for rash. Neurological: Positive for headaches, but denies focal weakness or numbness.   ____________________________________________   PHYSICAL EXAM:  VITAL SIGNS: ED Triage Vitals  Enc Vitals Group     BP 02/03/21 0918 128/89     Pulse Rate 02/03/21 0918 84     Resp 02/03/21 0918 20     Temp 02/03/21 0918 99.2 F (37.3 C)     Temp Source 02/03/21 0918 Oral     SpO2 02/03/21 0918 98 %     Weight 02/03/21 0909 250 lb 3.6 oz (113.5 kg)     Height 02/03/21 0909 6\' 1"  (1.854 m)     Head Circumference --      Peak Flow --      Pain Score 02/03/21 0909 0     Pain Loc --      Pain Edu? --      Excl. in GC? --    Constitutional: Alert and oriented. Well appearing and in no acute distress. Eyes: Conjunctivae are normal. PERRL. EOMI. Head: Atraumatic. Nose: Edematous nasal turbinates. Mouth/Throat: Mucous membranes are moist.  Oropharynx non-erythematous.  Postnasal drainage. EARS; edematous but not erythematous right TM. Neck: No stridor.  Hematological/Lymphatic/Immunilogical: No cervical lymphadenopathy. Cardiovascular: Normal rate, regular rhythm. Grossly normal heart sounds.  Good peripheral circulation. Respiratory: Normal respiratory effort.  No retractions. Lungs CTAB.  Cough with deep inspirations. Neurologic:  Normal speech and language. No gross focal neurologic deficits are appreciated. No gait instability. Skin:  Skin  is warm, dry and intact. No rash noted. Psychiatric: Mood and affect are normal. Speech and behavior are normal.  ____________________________________________   LABS (all labs ordered are listed, but only abnormal results are displayed)  Labs Reviewed  RESP PANEL BY RT-PCR (FLU A&B, COVID) [id number]   ____________________________________________  EKG   ____________________________________________  RADIOLOGY I, Joni Reining, personally viewed and evaluated these images (plain radiographs) as part of my medical decision making, as well as reviewing the written report by the radiologist.  ED MD interpretation: No acute findings on chest x-ray. Official radiology report(s): DG Chest Portable 1 View  Result Date: 02/03/2021 CLINICAL DATA:  Cough and sinus congestion EXAM: PORTABLE CHEST 1 VIEW COMPARISON:  08/14/2019 FINDINGS: The heart size and mediastinal contours are within normal limits. Both lungs are clear. The visualized skeletal structures are unremarkable. IMPRESSION: No active disease. Electronically Signed   By: Marnee Spring M.D.   On: 02/03/2021 10:13    ____________________________________________   PROCEDURES  Procedure(s) performed (including Critical Care):  Procedures   ____________________________________________   INITIAL IMPRESSION / ASSESSMENT AND PLAN / ED COURSE  As part of my medical decision making, I reviewed the following data within the electronic MEDICAL RECORD NUMBER         Patient presents with sinus congestion, fever, headache, body aches for 4 days.  Differential consist of influenza, COVID-19, sinusitis.  Discussed negative chest x-ray findings with patient.  Patient was negative for COVID-19 influenza.  Patient complaint physical exam assessment sinus congestion and bronchospasm.  Patient given discharge care instruction advised take medication as directed.      ____________________________________________   FINAL CLINICAL IMPRESSION(S) / ED DIAGNOSES  Final diagnoses:  Cough due to bronchospasm  Sinus congestion     ED Discharge Orders         Ordered    fexofenadine-pseudoephedrine (ALLEGRA-D) 60-120 MG 12 hr tablet  2 times daily        02/03/21 1124    benzonatate (TESSALON PERLES) 100 MG capsule  3 times daily PRN        02/03/21 1124    methylPREDNISolone (MEDROL DOSEPAK) 4 MG TBPK tablet        02/03/21 1124          *Please note:   [name] was evaluated in Emergency Department on 02/03/2021 for the symptoms described in the history of present illness. He was evaluated in the context of the global COVID-19 pandemic, which necessitated consideration that the patient might be at risk for infection with the SARS-CoV-2 virus that causes COVID-19. Institutional protocols and algorithms that pertain to the evaluation of patients at risk for COVID-19 are in a state of rapid change based on information released by regulatory bodies including the CDC and federal and state organizations. These policies and algorithms were followed during the patient's care in the ED.  Some ED evaluations and interventions may be delayed as a result of limited staffing during and the pandemic.*   Note:  This document was prepared using Dragon voice recognition software and may include unintentional dictation errors.    Joni Reining, PA-C 02/03/21 1125    Dionne Bucy, MD 02/03/21 1225

## 2021-02-03 NOTE — ED Triage Notes (Signed)
C/O sinus congestion, fever, headache x 4 days.  AAOx3.  Skin warm and dry. NAD

## 2021-02-03 NOTE — ED Notes (Signed)
See triage note  Presents with cough and nasal congestion  Low grade temp  States sx's started couple of days ago   States he has been using OTC med w/o relief

## 2021-09-22 ENCOUNTER — Emergency Department
Admission: EM | Admit: 2021-09-22 | Discharge: 2021-09-22 | Discharge status: Against Medical Advice | Payer: Self-pay | Source: Home / Self Care

## 2021-10-07 ENCOUNTER — Emergency Department: Payer: Self-pay

## 2021-10-07 ENCOUNTER — Emergency Department
Admission: EM | Admit: 2021-10-07 | Discharge: 2021-10-07 | Disposition: A | Discharge status: Home / Self Care | Payer: Self-pay | Attending: Emergency Medicine | Admitting: Emergency Medicine

## 2021-10-07 ENCOUNTER — Other Ambulatory Visit: Payer: Self-pay

## 2021-10-07 ENCOUNTER — Encounter: Payer: Self-pay | Admitting: Emergency Medicine

## 2021-10-07 DIAGNOSIS — M545 Low back pain, unspecified: Secondary | ICD-10-CM | POA: Insufficient documentation

## 2021-10-07 MED ORDER — HYDROCODONE-ACETAMINOPHEN 5-325 MG PO TABS
1.0000 | ORAL_TABLET | Freq: Four times a day (QID) | ORAL | 0 refills | Status: DC | PRN
Start: 2021-10-07 — End: 2022-04-19

## 2021-10-07 MED ORDER — BACLOFEN 10 MG PO TABS: 10.0000 mg | ORAL_TABLET | Freq: Three times a day (TID) | ORAL | 0 refills | Status: AC

## 2021-10-07 MED ORDER — PREDNISONE 10 MG (21) PO TBPK
ORAL_TABLET | ORAL | 0 refills | Status: DC
Start: 2021-10-07 — End: 2022-04-19

## 2021-10-07 MED ORDER — MORPHINE SULFATE (PF) 4 MG/ML IV SOLN
4.0000 mg | Freq: Once | INTRAVENOUS | Status: AC
  Administered 2021-10-07: 4 mg via INTRAMUSCULAR
  Filled 2021-10-07: qty 1

## 2021-10-07 MED ORDER — CYCLOBENZAPRINE HCL 10 MG PO TABS
10.0000 mg | ORAL_TABLET | Freq: Once | ORAL | Status: AC
  Administered 2021-10-07: 10 mg via ORAL
  Filled 2021-10-07: qty 1

## 2021-10-07 MED ORDER — ONDANSETRON 4 MG PO TBDP
4.0000 mg | ORAL_TABLET | Freq: Once | ORAL | Status: AC
  Administered 2021-10-07: 4 mg via ORAL
  Filled 2021-10-07: qty 1

## 2021-10-07 NOTE — ED Notes (Signed)
First Nurse Note:  Pt to ED via POV c/o lower back pain. Pt denies radiation of pain down his legs. Pt able to walk but has noticeable limp. Pt is in NAD.

## 2021-10-07 NOTE — ED Notes (Signed)
See triage note  presents with lower back pain  states pain started about 2 weeks ago  denies any injury but hx of sciatica  ambulates with limp

## 2021-10-07 NOTE — Discharge Instructions (Signed)
Follow-up with your regular doctor as needed.  Follow-up with Dr. Rudene Christians if your back is not improving in 1 week. Apply ice to your lower back.  Do not apply heat. Take your medications as prescribed.

## 2021-10-07 NOTE — ED Triage Notes (Signed)
Pt here with back pain for a few months. Pt ambulatory and states that pain has gotten better over time but has never left. Pt in NAD in triage.

## 2021-10-07 NOTE — ED Provider Notes (Signed)
[facility] Provider Note    Event Date/Time   First MD Initiated Contact with Patient 10/07/21 1046     (approximate)   History   Back Pain   HPI  Stanley Leach is a 51 y.o. male presents emergency department complaining of low back pain over the past 5 weeks.  States usually feet has back pain and will go away in 1 week.  States he is been unable to get it under control.  Is been using heat and over-the-counter medications.  States pain radiates around to the abdomen.  No dysuria, no hematuria, no vomiting or diarrhea.      Physical Exam   Triage Vital Signs: ED Triage Vitals  Enc Vitals Group     BP 10/07/21 1024 (!) 127/92     Pulse Rate 10/07/21 1024 74     Resp 10/07/21 1024 16     Temp 10/07/21 1024 98.3 F (36.8 C)     Temp Source 10/07/21 1024 Oral     SpO2 10/07/21 1024 97 %     Weight 10/07/21 1025 250 lb 3.6 oz (113.5 kg)     Height 10/07/21 1025 6\' 1"  (1.854 m)     Head Circumference --      Peak Flow --      Pain Score 10/07/21 1025 9     Pain Loc --      Pain Edu? --      Excl. in [place]? --     Most recent vital signs: Vitals:   10/07/21 1024  BP: (!) 127/92  Pulse: 74  Resp: 16  Temp: 98.3 F (36.8 C)  SpO2: 97%     General: Awake, no distress.   CV:  Good peripheral perfusion. regular rate and  rhythm Resp:  Normal effort. Abd:  No distention.  Nontender Other:  Lumbar spines slightly tender, paravertebral muscles are spasmed and tender, 5/5 strength lower extremities, neurovascular is intact, is able to walk without foot drop   ED Results / Procedures / Treatments   Labs (all labs ordered are listed, but only abnormal results are displayed) Labs Reviewed - No data to display   EKG     RADIOLOGY X-ray lumbar spine    PROCEDURES:   Procedures   MEDICATIONS ORDERED IN ED: Medications  morphine 4 MG/ML injection 4 mg (4 mg Intramuscular Given 10/07/21 1058)  cyclobenzaprine (FLEXERIL)  tablet 10 mg (10 mg Oral Given 10/07/21 1058)  ondansetron (ZOFRAN-ODT) disintegrating tablet 4 mg (4 mg Oral Given 10/07/21 1058)     IMPRESSION / MDM / ASSESSMENT AND PLAN / ED COURSE  I reviewed the triage vital signs and the nursing notes.                              Differential diagnosis includes, but is not limited to, lumbar strain, lumbar spasms, herniated disc, sciatica  X-ray of the lumbar spine was ordered  On my review the x-ray of the lumbar spine see degenerative changes only.  Radiologist read the x-ray as degenerative changes but similar to the past x-ray  Patient been given morphine and Flexeril.  He is having relief with medication.  I explained to him that we can start him on a steroid pack, muscle relaxer, pain medication.  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  He is to apply ice to the lower back.  Follow-up with Dr.  Rudene Christians if not improving in 1 week.  Return emergency department worsening.  Patient is in agreement treatment plan.  He was discharged stable condition.      FINAL CLINICAL IMPRESSION(S) / ED DIAGNOSES   Final diagnoses:  Acute midline low back pain without sciatica     Rx / DC Orders   ED Discharge Orders          Ordered    predniSONE (STERAPRED UNI-PAK 21 TAB) 10 MG (21) TBPK tablet        10/07/21 1153    baclofen (LIORESAL) 10 MG tablet  3 times daily        10/07/21 1153    HYDROcodone-acetaminophen (NORCO/VICODIN) 5-325 MG tablet  Every 6 hours PRN        10/07/21 1153             Note:  This document was prepared using Dragon voice recognition software and may include unintentional dictation errors.    Versie Starks, PA-C 10/07/21 1159    Harvest Dark, MD 10/07/21 1354

## 2022-02-28 ENCOUNTER — Emergency Department: Payer: Self-pay

## 2022-02-28 ENCOUNTER — Encounter: Payer: Self-pay | Admitting: Emergency Medicine

## 2022-02-28 ENCOUNTER — Emergency Department
Admission: EM | Admit: 2022-02-28 | Discharge: 2022-02-28 | Disposition: A | Discharge status: Home / Self Care | Payer: Self-pay | Attending: Emergency Medicine | Admitting: Emergency Medicine

## 2022-02-28 ENCOUNTER — Other Ambulatory Visit: Payer: Self-pay

## 2022-02-28 DIAGNOSIS — X501XXA Overexertion from prolonged static or awkward postures, initial encounter: Secondary | ICD-10-CM | POA: Insufficient documentation

## 2022-02-28 DIAGNOSIS — M25561 Pain in right knee: Secondary | ICD-10-CM | POA: Insufficient documentation

## 2022-02-28 NOTE — ED Triage Notes (Signed)
Pt reports was getting off the toilet Wednesday and felt a pop in high right knee and now he has swelling.

## 2022-02-28 NOTE — ED Provider Notes (Signed)
   [facility] Provider Note    Event Date/Time   First MD Initiated Contact with Patient 02/28/22 [phone]     (approximate)   History   Knee Pain   HPI  Stanley Leach is a 51 y.o. male  otherwise healthy with right knee pain. Started weds when he heard pop when standing and now feels like he cant straighten it all the way and has some pain with moving the knee to the sides. Swelling has come down but due to continued pain came to ER. Can still ambulate.  Did not fall onto the ground. No surgery in knee. NO fever    Physical Exam   Triage Vital Signs: ED Triage Vitals  Enc Vitals Group     BP 02/28/22 0845 135/67     Pulse Rate 02/28/22 0845 72     Resp 02/28/22 0845 18     Temp 02/28/22 0845 98.6 F (37 C)     Temp Source 02/28/22 0845 Oral     SpO2 02/28/22 0845 96 %     Weight 02/28/22 0843 249 lb 1.9 oz (113 kg)     Height 02/28/22 0843 6\' 1"  (1.854 m)     Head Circumference --      Peak Flow --      Pain Score 02/28/22 0843 3     Pain Loc --      Pain Edu? --      Excl. in [place]? --     Most recent vital signs: Vitals:   02/28/22 0845  BP: 135/67  Pulse: 72  Resp: 18  Temp: 98.6 F (37 C)  SpO2: 96%     General: Awake, no distress.  CV:  Good peripheral perfusion.  Resp:  Normal effort.  Abd:  No distention.  Other:  R knee mild swelling able to flex and extend but some tenderness with ambulating. NO warmth no erythema Good distal pulse. Able to extend and flex ankle. No calf swelling or pain.    ED Results / Procedures / Treatments   [place] [facility] I have reviewed the xray personally reviewed and interpreted and there is no evidence of the fracture or joint effusion  PROCEDURES:  Critical Care performed: No  Procedures   MEDICATIONS ORDERED IN ED: Medications - No data to display   IMPRESSION / MDM / [place] / ED COURSE  I reviewed the triage vital signs and the nursing notes.   Patient's  presentation is most consistent with acute, uncomplicated illness.   Possible fx vs dislocation vs sprain  Discussed xray negative, hinge brace, ibuprofen and tylenol for pain and f/u with ortho to discuss MRI if not getting better for meniscus or ligament injruy.   I reviewed note from 12/02/20 by Dr. Cleda Mccreedy for achilles tendinitis.      FINAL CLINICAL IMPRESSION(S) / ED DIAGNOSES   Final diagnoses:  Acute pain of right knee     Rx / DC Orders   ED Discharge Orders     None        Note:  This document was prepared using Dragon voice recognition software and may include unintentional dictation errors.   Vanessa [place], MD 02/28/22 1002

## 2022-02-28 NOTE — ED Notes (Signed)
Pt with c/o right knee "popping", pt states that he can still walk and pain is minor. Minor swelling noted to right knee.

## 2022-02-28 NOTE — Discharge Instructions (Addendum)
Use tylenol 1g every 8 hours Ibuprofen 600 every 6-8 with food.  Knee brace as needed for comfort  Call ortho for follow up.   FINDINGS: No evidence of fracture, dislocation, or joint effusion. No evidence of arthropathy or other focal bone abnormality. Soft tissues are unremarkable.   IMPRESSION: Negative.

## 2022-04-19 ENCOUNTER — Inpatient Hospital Stay
Admission: EM | Admit: 2022-04-19 | Discharge: 2022-04-21 | DRG: 379 | Disposition: A | Discharge status: Home / Self Care | Payer: Self-pay | Attending: Internal Medicine | Admitting: Internal Medicine

## 2022-04-19 ENCOUNTER — Other Ambulatory Visit: Payer: Self-pay

## 2022-04-19 ENCOUNTER — Emergency Department: Payer: Self-pay

## 2022-04-19 DIAGNOSIS — Z6835 Body mass index (BMI) 35.0-35.9, adult: Secondary | ICD-10-CM

## 2022-04-19 DIAGNOSIS — K5731 Diverticulosis of large intestine without perforation or abscess with bleeding: Principal | ICD-10-CM | POA: Diagnosis present

## 2022-04-19 DIAGNOSIS — F17210 Nicotine dependence, cigarettes, uncomplicated: Secondary | ICD-10-CM | POA: Diagnosis present

## 2022-04-19 DIAGNOSIS — R030 Elevated blood-pressure reading, without diagnosis of hypertension: Secondary | ICD-10-CM

## 2022-04-19 DIAGNOSIS — Z720 Tobacco use: Secondary | ICD-10-CM

## 2022-04-19 DIAGNOSIS — Z8249 Family history of ischemic heart disease and other diseases of the circulatory system: Secondary | ICD-10-CM

## 2022-04-19 DIAGNOSIS — K5791 Diverticulosis of intestine, part unspecified, without perforation or abscess with bleeding: Secondary | ICD-10-CM

## 2022-04-19 DIAGNOSIS — I700 Atherosclerosis of aorta: Secondary | ICD-10-CM | POA: Diagnosis present

## 2022-04-19 DIAGNOSIS — E669 Obesity, unspecified: Secondary | ICD-10-CM | POA: Diagnosis present

## 2022-04-19 DIAGNOSIS — K922 Gastrointestinal hemorrhage, unspecified: Secondary | ICD-10-CM | POA: Diagnosis present

## 2022-04-19 DIAGNOSIS — Z823 Family history of stroke: Secondary | ICD-10-CM

## 2022-04-19 DIAGNOSIS — K529 Noninfective gastroenteritis and colitis, unspecified: Secondary | ICD-10-CM

## 2022-04-19 DIAGNOSIS — Z818 Family history of other mental and behavioral disorders: Secondary | ICD-10-CM

## 2022-04-19 LAB — COMPREHENSIVE METABOLIC PANEL
ALT: 23 U/L (ref 0–44)
AST: 18 U/L (ref 15–41)
Albumin: 3.9 g/dL (ref 3.5–5.0)
Alkaline Phosphatase: 65 U/L (ref 38–126)
Anion gap: 9 (ref 5–15)
BUN: 12 mg/dL (ref 6–20)
CO2: 27 mmol/L (ref 22–32)
Calcium: 9.2 mg/dL (ref 8.9–10.3)
Chloride: 102 mmol/L (ref 98–111)
Creatinine, Ser: 1.05 mg/dL (ref 0.61–1.24)
GFR, Estimated: >60 mL/min (ref >60)
Glucose, Bld: 105 mg/dL — ABNORMAL HIGH (ref 70–99)
Potassium: 4 mmol/L (ref 3.5–5.1)
Sodium: 138 mmol/L (ref 135–145)
Total Bilirubin: 0.7 mg/dL (ref 0.3–1.2)
Total Protein: 7.2 g/dL (ref 6.5–8.1)

## 2022-04-19 LAB — CBC
HCT: 44.4 % (ref 39.0–52.0)
Hemoglobin: 14.8 g/dL (ref 13.0–17.0)
MCH: 30.1 pg (ref 26.0–34.0)
MCHC: 33.3 g/dL (ref 30.0–36.0)
MCV: 90.4 fL (ref 80.0–100.0)
Platelets: 380 10*3/uL (ref 150–400)
RBC: 4.91 MIL/uL (ref 4.22–5.81)
RDW: 12.3 % (ref 11.5–15.5)
WBC: 9.5 10*3/uL (ref 4.0–10.5)
nRBC: 0 % (ref 0.0–0.2)

## 2022-04-19 LAB — HEMOGLOBIN AND HEMATOCRIT, BLOOD
HCT: 40.3 % (ref 39.0–52.0)
Hemoglobin: 13.2 g/dL (ref 13.0–17.0)

## 2022-04-19 LAB — TYPE AND SCREEN
ABO/RH(D): O POS
Antibody Screen: NEGATIVE

## 2022-04-19 MED ORDER — SODIUM CHLORIDE 0.9 % IV SOLN
INTRAVENOUS | Status: DC
Start: 2022-04-20 — End: 2022-04-21

## 2022-04-19 MED ORDER — IOHEXOL 350 MG/ML SOLN
100.0000 mL | Freq: Once | INTRAVENOUS | Status: AC | PRN
  Administered 2022-04-19: 100 mL via INTRAVENOUS

## 2022-04-19 MED ORDER — TRAZODONE HCL 50 MG PO TABS: 25.0000 mg | ORAL_TABLET | Freq: Every evening | ORAL | Status: DC | PRN

## 2022-04-19 MED ORDER — ACETAMINOPHEN 650 MG RE SUPP: 650.0000 mg | Freq: Four times a day (QID) | RECTAL | Status: DC | PRN

## 2022-04-19 MED ORDER — ONDANSETRON HCL 4 MG PO TABS: 4.0000 mg | ORAL_TABLET | Freq: Four times a day (QID) | ORAL | Status: DC | PRN

## 2022-04-19 MED ORDER — LACTATED RINGERS IV BOLUS
1000.0000 mL | Freq: Once | INTRAVENOUS | Status: AC
  Administered 2022-04-19: 1000 mL via INTRAVENOUS

## 2022-04-19 MED ORDER — ACETAMINOPHEN 325 MG PO TABS: 650.0000 mg | ORAL_TABLET | Freq: Four times a day (QID) | ORAL | Status: DC | PRN

## 2022-04-19 MED ORDER — ONDANSETRON HCL 4 MG/2ML IJ SOLN: 4.0000 mg | Freq: Four times a day (QID) | INTRAMUSCULAR | Status: DC | PRN

## 2022-04-19 NOTE — ED Provider Notes (Signed)
[facility] Provider Note    Event Date/Time   First MD Initiated Contact with Patient 04/19/22 2033     (approximate)   History   Chief Complaint GI Bleeding   HPI  Stanley Leach is a 51 y.o. male with no significant past medical history who presents to the ED complaining of GI bleeding.  Patient reports that earlier this morning he started to notice bright red blood mixed with his stool and when he wiped.  Later in the evening, he then had a large bowel movement where he passed nothing but bright red blood.  He sought care in the ED, states he has since had 2 bloody bowel movements here in the waiting room.  He reports diffuse crampy pain in his abdomen, denies any rectal pain.  He does admit to a history of hemorrhoids but states this bleeding does not seem like it is coming from his rectal area.  He has not had any nausea or vomiting, denies recent dark black stool.  He denies significant NSAID or steroid use, does not regularly consume alcohol.     Physical Exam   Triage Vital Signs: ED Triage Vitals  Enc Vitals Group     BP 04/19/22 1910 (!) 136/99     Pulse Rate 04/19/22 1909 70     Resp 04/19/22 1909 16     Temp 04/19/22 1909 99 F (37.2 C)     Temp Source 04/19/22 1909 Oral     SpO2 04/19/22 1909 100 %     Weight 04/19/22 1909 270 lb (122.5 kg)     Height 04/19/22 1909 6\' 1"  (1.854 m)     Head Circumference --      Peak Flow --      Pain Score 04/19/22 1909 0     Pain Loc --      Pain Edu? --      Excl. in [place]? --     Most recent vital signs: Vitals:   04/19/22 2058 04/19/22 2300  BP: (!) 157/97 (!) 132/92  Pulse: 75 71  Resp: 17 17  Temp:    SpO2: 100% 99%    Constitutional: Alert and oriented. Eyes: Conjunctivae are normal. Head: Atraumatic. Nose: No congestion/rhinnorhea. Mouth/Throat: Mucous membranes are moist.  Cardiovascular: Normal rate, regular rhythm. Grossly normal heart sounds.  2+ radial pulses  bilaterally. Respiratory: Normal respiratory effort.  No retractions. Lungs CTAB. Gastrointestinal: Soft and diffusely tender to palpation with no rebound or guarding. No distention.  Rectal exam with reddish stool that is guaiac positive, no bleeding hemorrhoids noted. Musculoskeletal: No lower extremity tenderness nor edema.  Neurologic:  Normal speech and language. No gross focal neurologic deficits are appreciated.    ED Results / Procedures / Treatments   Labs (all labs ordered are listed, but only abnormal results are displayed) Labs Reviewed  COMPREHENSIVE METABOLIC PANEL - Abnormal; Notable for the following components:      Result Value   Glucose, Bld 105 (*)    All other components within normal limits  CBC  HEMOGLOBIN AND HEMATOCRIT, BLOOD  TYPE AND SCREEN   RADIOLOGY CTA of abdomen/pelvis reviewed and interpreted by me with active extravasation noted at the transverse colon.  PROCEDURES:  Critical Care performed: Yes, see critical care procedure note(s)  .Critical Care  Performed by: 04/21/22, MD Authorized by: Chesley Noon, MD   Critical care provider statement:    Critical care time (minutes):  30   Critical care time  was exclusive of:  Separately billable procedures and treating other patients and teaching time   Critical care was necessary to treat or prevent imminent or life-threatening deterioration of the following conditions:  Circulatory failure   Critical care was time spent personally by me on the following activities:  Development of treatment plan with patient or surrogate, discussions with consultants, evaluation of patient's response to treatment, examination of patient, ordering and review of laboratory studies, ordering and review of radiographic studies, ordering and performing treatments and interventions, pulse oximetry, re-evaluation of patient's condition and review of old charts   I assumed direction of critical care for this patient  from another provider in my specialty: no     Care discussed with: admitting provider      MEDICATIONS ORDERED IN ED: Medications  lactated ringers bolus 1,000 mL (0 mLs Intravenous Stopped 04/19/22 2330)  iohexol (OMNIPAQUE) 350 MG/ML injection 100 mL (100 mLs Intravenous Contrast Given 04/19/22 2128)     IMPRESSION / MDM / ASSESSMENT AND PLAN / ED COURSE  I reviewed the triage vital signs and the nursing notes.                              51 y.o. male with no significant past medical history who presents to the ED complaining of multiple bloody bowel movements over the course of the day today.  Patient's presentation is most consistent with acute presentation with potential threat to life or bodily function.  Differential diagnosis includes, but is not limited to, diverticular bleed, other lower GI bleed, upper GI bleed, anemia, hemorrhoidal bleed.  Patient well-appearing and in no acute distress, vital signs are unremarkable.  He does have multiple photos of large-volume bloody bowel movements, initial labs are reassuring with stable hemoglobin, however labs obtained prior to 2 of these bowel movements.  We will trend hemoglobin and further assess with CTA of his abdomen/pelvis.  Additional labs are reassuring with no significant electrolyte abnormality or AKI, LFTs within normal limits.  CTA of abdomen/pelvis shows active extravasation at the transverse colon.  Hemoglobin slightly downtrending by 1.5 on recheck although patient remains hemodynamically stable.  Case discussed with Dr. Derrill Memo of vascular surgery, who states that he does not perform embolization and recommends further discussion with IR versus transfer.  Case discussed with vascular surgery at [facility], Dr. Lenell Antu, who also does not perform embolization and recommends discussion with IR.  Case discussed with Dr. Bryn Gulling of IR, who is available for embolization as needed for GI bleed, but does not feel patient requires  embolization emergently this evening given he is otherwise stable.  Case discussed with hospitalist for admission.      FINAL CLINICAL IMPRESSION(S) / ED DIAGNOSES   Final diagnoses:  Gastrointestinal hemorrhage, unspecified gastrointestinal hemorrhage type     Rx / DC Orders   ED Discharge Orders     None        Note:  This document was prepared using Dragon voice recognition software and may include unintentional dictation errors.   Chesley Noon, MD 04/19/22 [phone]

## 2022-04-19 NOTE — H&P (Signed)
[place]   PATIENT NAME: Stanley Leach    MR#:  [id number]  DATE OF BIRTH:  11/09/1970  DATE OF ADMISSION:  04/19/2022  PRIMARY CARE PHYSICIAN: Patient, No Pcp Per   Patient is coming from: Home  REQUESTING/REFERRING PHYSICIAN: Blake Divine, MD  CHIEF COMPLAINT:   Chief Complaint  Patient presents with   GI Bleeding    HISTORY OF PRESENT ILLNESS:  Stanley Leach is a 51 y.o. male with medical history significant for tobacco abuse, presented to the emergency room with acute onset of bright red bleeding per rectum that started before he came to the ER and has been recurrent in the waiting room as well as in the ER.  He noticed some blood clots as well.  He denied any melena or bright red bleeding per rectum.  No nausea or vomiting however he had some abdominal cramps with associated diarrhea.  No fever or chills.  No dyspnea or cough or wheezing.  No dysuria, oliguria or hematuria or flank pain.  No chest pain or palpitations.  No other bleeding diathesis.  He denied taking any recent antibiotics.  ED Course: When he came to the ER, BP was 147/93 and later 113/74 at that she was 99 with otherwise normal vital signs.  Labs revealed unremarkable CMP and CBC showed hemoglobin of 14.8 and hematocrit 44.4.  Repeat H&H in 3 hours were 13.2 and 40.3.  Imaging: CT angiography of the abdomen pelvis revealed diverticulosis of the colon with wall thickening around the splenic flexure suggesting colitis as well as mild stranding around the colon with focal area of contrast extravasation in the splenic limb of the transverse colon likely presenting with GI hemorrhage from a diverticulum.  The patient was typed and screened and was given 1 L bolus of IV lactated Ringer.  Contact was made with vascular surgery here and at [facility] as well as interventional radiology.  IR will be willing to intervene if need be if he continues to have recurrent bleeding tonight.  All were  agreeable with admission here at [facility].  He will be admitted to a progressive unit bed for further evaluation and management. PAST MEDICAL HISTORY:   Tobacco use  PAST SURGICAL HISTORY:  No past surgical history on file.  He denies any previous surgeries.  SOCIAL HISTORY:   Social History   Tobacco Use   Smoking status: Heavy Smoker    Packs/day: 0.50    Types: Cigarettes   Smokeless tobacco: Never  Substance Use Topics   Alcohol use: Yes  He smokes 1-1 and half pack of cigarettes per week.  He drinks alcohol socially.  FAMILY HISTORY:   Positive for MI and CVA in his grandfather and CVA in his grandmother.  No history of cancer.  DRUG ALLERGIES:  No Known Allergies  REVIEW OF SYSTEMS:   ROS As per history of present illness. All pertinent systems were reviewed above. Constitutional, HEENT, cardiovascular, respiratory, GI, GU, musculoskeletal, neuro, psychiatric, endocrine, integumentary and hematologic systems were reviewed and are otherwise negative/unremarkable except for positive findings mentioned above in the HPI.   MEDICATIONS AT HOME:   Prior to Admission medications   Not on File      VITAL SIGNS:  Blood pressure (!) 132/92, pulse 71, temperature 99 F (37.2 C), temperature source Oral, resp. rate 17, height 6\' 1"  (1.854 m), weight 122.5 kg, SpO2 99 %.  PHYSICAL EXAMINATION:  Physical Exam  GENERAL:  51 y.o.-year-old Caucasian male patient  lying in the bed with no acute distress.  EYES: Pupils equal, round, reactive to light and accommodation. No scleral icterus. Extraocular muscles intact.  HEENT: Head atraumatic, normocephalic. Oropharynx and nasopharynx clear.  NECK:  Supple, no jugular venous distention. No thyroid enlargement, no tenderness.  LUNGS: Normal breath sounds bilaterally, no wheezing, rales,rhonchi or crepitation. No use of accessory muscles of respiration.  CARDIOVASCULAR: Regular rate and rhythm, S1, S2 normal. No murmurs, rubs, or gallops.   ABDOMEN: Soft, nondistended, nontender. Bowel sounds present. No organomegaly or mass.  EXTREMITIES: No pedal edema, cyanosis, or clubbing.  NEUROLOGIC: Cranial nerves II through XII are intact. Muscle strength 5/5 in all extremities. Sensation intact. Gait not checked.  PSYCHIATRIC: The patient is alert and oriented x 3.  Normal affect and good eye contact. SKIN: No obvious rash, lesion, or ulcer.   LABORATORY PANEL:   CBC Recent Labs  Lab 04/19/22 1912 04/19/22 2151  WBC 9.5  --   HGB 14.8 13.2  HCT 44.4 40.3  PLT 380  --    ------------------------------------------------------------------------------------------------------------------  Chemistries  Recent Labs  Lab 04/19/22 1912  NA 138  K 4.0  CL 102  CO2 27  GLUCOSE 105*  BUN 12  CREATININE 1.05  CALCIUM 9.2  AST 18  ALT 23  ALKPHOS 65  BILITOT 0.7   ------------------------------------------------------------------------------------------------------------------  Cardiac Enzymes No results for input(s): "TROPONINI" in the last 168 hours. ------------------------------------------------------------------------------------------------------------------  RADIOLOGY:  CT Angio Abd/Pel W and/or Wo Contrast  Result Date: 04/19/2022 CLINICAL DATA:  Lower GI bleed. Bloody stools since this morning. Abdominal pain. EXAM: CTA ABDOMEN AND PELVIS WITHOUT AND WITH CONTRAST TECHNIQUE: Multidetector CT imaging of the abdomen and pelvis was performed using the standard protocol during bolus administration of intravenous contrast. Multiplanar reconstructed images and MIPs were obtained and reviewed to evaluate the vascular anatomy. RADIATION DOSE REDUCTION: This exam was performed according to the departmental dose-optimization program which includes automated exposure control, adjustment of the mA and/or kV according to patient size and/or use of iterative reconstruction technique. CONTRAST:  OMNIPAQUE IOHEXOL 350 MG/ML  SOLN COMPARISON:  10/21/2020 and 08/14/2019 FINDINGS: VASCULAR Aorta: Unenhanced images demonstrate scattered aortic calcification. Images obtained during arterial phase after contrast administration demonstrate normal caliber abdominal aorta. No dissection. No significant stenosis. Celiac: Patent without evidence of aneurysm, dissection, vasculitis or significant stenosis. SMA: Patent without evidence of aneurysm, dissection, vasculitis or significant stenosis. Renals: Both renal arteries are patent without evidence of aneurysm, dissection, vasculitis, fibromuscular dysplasia or significant stenosis. IMA: Patent without evidence of aneurysm, dissection, vasculitis or significant stenosis. Inflow: Patent without evidence of aneurysm, dissection, vasculitis or significant stenosis. Proximal Outflow: Bilateral common femoral and visualized portions of the superficial and profunda femoral arteries are patent without evidence of aneurysm, dissection, vasculitis or significant stenosis. Veins: No obvious venous abnormality within the limitations of this arterial phase study. Review of the MIP images confirms the above findings. NON-VASCULAR Lower chest: Lung bases are clear. Hepatobiliary: No focal liver abnormality is seen. No gallstones, gallbladder wall thickening, or biliary dilatation. Pancreas: Unremarkable. No pancreatic ductal dilatation or surrounding inflammatory changes. Spleen: Normal in size without focal abnormality. Adrenals/Urinary Tract: Adrenal glands are unremarkable. Kidneys are normal, without renal calculi, focal lesion, or hydronephrosis. Bladder is unremarkable. Stomach/Bowel: Stomach, small bowel, and colon are not abnormally distended. There is a regional area of wall thickening involving the splenic limb of the transverse colon, splenic flexure, and proximal descending colon. Mild pericolonic stranding. Changes likely to represent colitis. This could reflect diverticulitis,  infectious or  inflammatory causes. There is a focal area of intraluminal contrast extravasation at the splenic limb of the transverse colon likely indicating the site of gastrointestinal hemorrhage. Diverticulosis of the colon. Appendix is normal. Lymphatic: No significant lymphadenopathy. Reproductive: Prostate is unremarkable. Other: No free air or free fluid in the abdomen. Abdominal wall musculature appears intact. Musculoskeletal: No acute or significant osseous findings. IMPRESSION: VASCULAR Aortic atherosclerosis.  No large vessel occlusion. NON-VASCULAR Diverticulosis of the colon. Wall thickening around the splenic flexure suggesting colitis. Mild stranding around the colon. Focal area of contrast extravasation in the splenic limb of the transverse colon likely representing gastrointestinal hemorrhage from a diverticulum. Electronically Signed   By: Burman Nieves M.D.   On: 04/19/2022 22:13      IMPRESSION AND PLAN:  Assessment and Plan: * GI bleeding - The patient was admitted to a PCU bed. - He was typed and crossmatched and we will follow serial hemoglobins and hematocrits.  He had a slight drop in his H&H within 3 hours. - GI consultation will be obtained. - I notified Dr. Allegra Lai about the patient. - IR consult will be obtained as well in AM.  They are aware about the patient. - We will place him on hydration with IV normal saline.  Diverticulosis of intestine with bleeding - This is clearly the culprit for his bleeding that is thought to be related to bleeding from a transverse colon diverticulum. - Management as above.  Acute colitis -This could certainly be contributing to his bleeding. - We will place him on IV Rocephin and Flagyl.  Elevated blood pressure reading - This could be related to initial bleeding. - We will continue monitoring his blood pressure.  Tobacco abuse -I Counseled him for smoking cessation and he will receive further counseling here.    DVT prophylaxis:  SCDs. Advanced Care Planning:  Code Status: full code.  Family Communication:  The plan of care was discussed in details with the patient (and family). I answered all questions. The patient agreed to proceed with the above mentioned plan. Further management will depend upon hospital course. Disposition Plan: Back to previous home environment Consults called: none.  All the records are reviewed and case discussed with ED provider.  Status is: Inpatient    At the time of the admission, it appears that the appropriate admission status for this patient is inpatient.  This is judged to be reasonable and necessary in order to provide the required intensity of service to ensure the patient's safety given the presenting symptoms, physical exam findings and initial radiographic and laboratory data in the context of comorbid conditions.  The patient requires inpatient status due to high intensity of service, high risk of further deterioration and high frequency of surveillance required.  I certify that at the time of admission, it is my clinical judgment that the patient will require inpatient hospital care extending more than 2 midnights.                            Dispo: The patient is from: Home              Anticipated d/c is to: Home              Patient currently is not medically stable to d/c.              Difficult to place patient: No  Hannah Beat M.D on 04/20/2022 at 12:57  AM  [facility]   From 7 PM-7 AM, contact night-coverage [email or web address]  CC: Primary care physician; Patient, No Pcp Per

## 2022-04-19 NOTE — ED Triage Notes (Addendum)
Pt states has been having bloody stools since this am. Pt denies use of anticoagulants. Pt with pwd skin. Pt states earlier in week he did have abd pain. Pt states is "just pouring pure blood now".

## 2022-04-20 ENCOUNTER — Encounter: Payer: Self-pay | Admitting: Internal Medicine

## 2022-04-20 DIAGNOSIS — E669 Obesity, unspecified: Secondary | ICD-10-CM

## 2022-04-20 DIAGNOSIS — K5791 Diverticulosis of intestine, part unspecified, without perforation or abscess with bleeding: Secondary | ICD-10-CM

## 2022-04-20 DIAGNOSIS — Z720 Tobacco use: Secondary | ICD-10-CM

## 2022-04-20 DIAGNOSIS — K922 Gastrointestinal hemorrhage, unspecified: Secondary | ICD-10-CM | POA: Diagnosis present

## 2022-04-20 DIAGNOSIS — K529 Noninfective gastroenteritis and colitis, unspecified: Secondary | ICD-10-CM

## 2022-04-20 DIAGNOSIS — R030 Elevated blood-pressure reading, without diagnosis of hypertension: Secondary | ICD-10-CM

## 2022-04-20 LAB — BASIC METABOLIC PANEL
Anion gap: 7 (ref 5–15)
BUN: 11 mg/dL (ref 6–20)
CO2: 25 mmol/L (ref 22–32)
Calcium: 8.9 mg/dL (ref 8.9–10.3)
Chloride: 105 mmol/L (ref 98–111)
Creatinine, Ser: 0.87 mg/dL (ref 0.61–1.24)
GFR, Estimated: >60 mL/min (ref >60)
Glucose, Bld: 109 mg/dL — ABNORMAL HIGH (ref 70–99)
Potassium: 3.8 mmol/L (ref 3.5–5.1)
Sodium: 137 mmol/L (ref 135–145)

## 2022-04-20 LAB — CBC
HCT: 40 % (ref 39.0–52.0)
Hemoglobin: 13.2 g/dL (ref 13.0–17.0)
MCH: 29.8 pg (ref 26.0–34.0)
MCHC: 33 g/dL (ref 30.0–36.0)
MCV: 90.3 fL (ref 80.0–100.0)
Platelets: 330 10*3/uL (ref 150–400)
RBC: 4.43 MIL/uL (ref 4.22–5.81)
RDW: 12.5 % (ref 11.5–15.5)
WBC: 8.1 10*3/uL (ref 4.0–10.5)
nRBC: 0 % (ref 0.0–0.2)

## 2022-04-20 LAB — HEMOGLOBIN AND HEMATOCRIT, BLOOD
HCT: 38.9 % — ABNORMAL LOW (ref 39.0–52.0)
Hemoglobin: 12.8 g/dL — ABNORMAL LOW (ref 13.0–17.0)

## 2022-04-20 MED ORDER — METRONIDAZOLE 500 MG/100ML IV SOLN
500.0000 mg | Freq: Two times a day (BID) | INTRAVENOUS | Status: DC
  Administered 2022-04-20 – 2022-04-21 (×2): 500 mg via INTRAVENOUS
  Filled 2022-04-20 (×3): qty 100

## 2022-04-20 MED ORDER — OXYCODONE-ACETAMINOPHEN 5-325 MG PO TABS: 1.0000 | ORAL_TABLET | Freq: Four times a day (QID) | ORAL | Status: DC | PRN

## 2022-04-20 MED ORDER — MORPHINE SULFATE (PF) 2 MG/ML IV SOLN: 1.0000 mg | INTRAVENOUS | Status: DC | PRN

## 2022-04-20 MED ORDER — SODIUM CHLORIDE 0.9 % IV SOLN
2.0000 g | INTRAVENOUS | Status: DC
  Administered 2022-04-20: 2 g via INTRAVENOUS
  Filled 2022-04-20: qty 20
  Filled 2022-04-20: qty 2

## 2022-04-20 NOTE — ED Notes (Signed)
Rn aware bed assigned 

## 2022-04-20 NOTE — Assessment & Plan Note (Signed)
-   This is clearly the culprit for his bleeding that is thought to be related to bleeding from a transverse colon diverticulum. - Management as above.

## 2022-04-20 NOTE — Assessment & Plan Note (Signed)
-  I Counseled him for smoking cessation and he will receive further counseling here.

## 2022-04-20 NOTE — Assessment & Plan Note (Signed)
-  This could certainly be contributing to his bleeding. - We will place him on IV Rocephin and Flagyl.

## 2022-04-20 NOTE — Progress Notes (Signed)
Admission profile updated. ?

## 2022-04-20 NOTE — Assessment & Plan Note (Signed)
-   The patient was admitted to a PCU bed. - He was typed and crossmatched and we will follow serial hemoglobins and hematocrits.  He had a slight drop in his H&H within 3 hours. - GI consultation will be obtained. - I notified Dr. Allegra Lai about the patient. - IR consult will be obtained as well in AM.  They are aware about the patient. - We will place him on hydration with IV normal saline.

## 2022-04-20 NOTE — Consult Note (Signed)
[facility] Vascular Consult Note  MRN : [id number]  Stanley Leach is a 51 y.o. ([birth year]) male who presents with chief complaint of  Chief Complaint  Patient presents with   GI Bleeding  .   Consulting Physician: Valente David, MD Reason for consult: GI Bleed History of Present Illness: Stanley Leach is a 51 year old man with a PMH significant to for tobacco usage, presented to [facility] due to passing [facility].  He noticed some clotting.  He denies any melena but there has been so abdominal cramps with diarrhea.  There is no nausea or vomiting.  No fever or chills, no chest pain or palpitations.  The patient notes that his last bloody stool was at 1 AM.  Most recent HGB was 12.8, from 14.8.  CT angiography of the abdomen pelvis revealed diverticulosis of the colon with wall thickening around the splenic flexure suggesting colitis as well as mild stranding around the colon with focal area of contrast extravasation in the splenic limb of the transverse colon likely presenting with GI hemorrhage from a diverticulum.  Current Facility-Administered Medications  Medication Dose Route Frequency Provider Last Rate Last Admin   0.9 %  sodium chloride infusion   Intravenous Continuous Mansy, Jan A, MD 100 mL/hr at 04/20/22 0545 New Bag at 04/20/22 0545   acetaminophen (TYLENOL) tablet 650 mg  650 mg Oral Q6H PRN Mansy, Jan A, MD       Or   acetaminophen (TYLENOL) suppository 650 mg  650 mg Rectal Q6H PRN Mansy, Jan A, MD       morphine (PF) 2 MG/ML injection 1 mg  1 mg Intravenous Q4H PRN Charise Killian, MD       ondansetron [facility]) tablet 4 mg  4 mg Oral Q6H PRN Mansy, Jan A, MD       Or   ondansetron [facility]) injection 4 mg  4 mg Intravenous Q6H PRN Mansy, Jan A, MD       oxyCODONE-acetaminophen (PERCOCET/ROXICET) 5-325 MG per tablet 1 tablet  1 tablet Oral Q6H PRN Charise Killian, MD       traZODone (DESYREL) tablet 25 mg  25 mg Oral QHS PRN Mansy,  Vernetta Honey, MD       No current outpatient medications on file.    No past medical history on file.  No past surgical history on file.  Social History Social History   Tobacco Use   Smoking status: Heavy Smoker    Packs/day: 0.50    Types: Cigarettes   Smokeless tobacco: Never  Substance Use Topics   Alcohol use: Yes   Drug use: No    Family History No family history on file.  No Known Allergies   REVIEW OF SYSTEMS (Negative unless checked)  Constitutional: [] Weight loss  [] Fever  [] Chills Cardiac: [] Chest pain   [] Chest pressure   [] Palpitations   [] Shortness of breath when laying flat   [] Shortness of breath at rest   [] Shortness of breath with exertion. Vascular:  [] Pain in legs with walking   [] Pain in legs at rest   [] Pain in legs when laying flat   [] Claudication   [] Pain in feet when walking  [] Pain in feet at rest  [] Pain in feet when laying flat   [] History of DVT   [] Phlebitis   [] Swelling in legs   [] Varicose veins   [] Non-healing ulcers Pulmonary:   [] Uses home oxygen   [] Productive cough   [] Hemoptysis   [] Wheeze  []   COPD   [] Asthma Neurologic:  [] Dizziness  [] Blackouts   [] Seizures   [] History of stroke   [] History of TIA  [] Aphasia   [] Temporary blindness   [] Dysphagia   [] Weakness or numbness in arms   [] Weakness or numbness in legs Musculoskeletal:  [] Arthritis   [] Joint swelling   [] Joint pain   [] Low back pain Hematologic:  [] Easy bruising  [] Easy bleeding   [] Hypercoagulable state   [] Anemic  [] Hepatitis Gastrointestinal:  [x] Blood in stool   [] Vomiting blood  [] Gastroesophageal reflux/heartburn   [] Difficulty swallowing. Genitourinary:  [] Chronic kidney disease   [] Difficult urination  [] Frequent urination  [] Burning with urination   [] Blood in urine Skin:  [] Rashes   [] Ulcers   [] Wounds Psychological:  [] History of anxiety   []  History of major depression.  Physical Examination  Vitals:   04/19/22 2300 04/20/22 0130 04/20/22 0544 04/20/22 1346  BP: (!)  132/92 106/68 129/86 132/82  Pulse: 71 60 72 71  Resp: 17 17 17 18   Temp:  97.8 F (36.6 C) 97.8 F (36.6 C) 98.2 F (36.8 C)  TempSrc:  Oral  Oral  SpO2: 99% 100% 100% 97%  Weight:      Height:       Body mass index is 35.62 kg/m. Gen:  WD/WN, NAD Head: [place]/AT, No temporalis wasting. Prominent temp pulse not noted. Ear/Nose/Throat: Hearing grossly intact, nares w/o erythema or drainage, oropharynx w/o Erythema/Exudate Eyes: Sclera non-icteric, conjunctiva clear Neck: Trachea midline.  No JVD.  Pulmonary:  Good air movement, respirations not labored, equal bilaterally.  Cardiac: RRR, normal S1, S2. Vascular:  PT Palpable Palpable  DP Palpable Palpable   Gastrointestinal: soft, non-tender/non-distended. No guarding/reflex.  Musculoskeletal: M/S 5/5 throughout.  Extremities without ischemic changes.  No deformity or atrophy. No edema. Neurologic: Sensation grossly intact in extremities.  Symmetrical.  Speech is fluent. Motor exam as listed above. Psychiatric: Judgment intact, Mood & affect appropriate for pt's clinical situation. Dermatologic: No rashes or ulcers noted.  No cellulitis or open wounds. Lymph : No Cervical, Axillary, or Inguinal lymphadenopathy.    CBC Lab Results  Component Value Date   WBC 8.1 04/20/2022   HGB 12.8 (L) 04/20/2022   HCT 38.9 (L) 04/20/2022   MCV 90.3 04/20/2022   PLT 330 04/20/2022    BMET    Component Value Date/Time   NA 137 04/20/2022 0620   NA 137 09/23/2014 1545   K 3.8 04/20/2022 0620   K 4.1 09/23/2014 1545   CL 105 04/20/2022 0620   CL 100 09/23/2014 1545   CO2 25 04/20/2022 0620   CO2 29 09/23/2014 1545   GLUCOSE 109 (H) 04/20/2022 0620   GLUCOSE 93 09/23/2014 1545   BUN 11 04/20/2022 0620   BUN 10 09/23/2014 1545   CREATININE 0.87 04/20/2022 0620   CREATININE 1.04 09/23/2014 1545   CALCIUM 8.9 04/20/2022 0620   CALCIUM 8.8 09/23/2014 1545   GFRNONAA >60 04/20/2022 0620   GFRNONAA >60 09/23/2014 1545   GFRNONAA >60  09/09/2013 1726   GFRAA >60 08/14/2019 1128   GFRAA >60 09/23/2014 1545   GFRAA >60 09/09/2013 1726   Estimated Creatinine Clearance: 142.3 mL/min (by C-G formula based on SCr of 0.87 mg/dL).  COAG No results found for: "INR", "PROTIME"  Radiology CT Angio Abd/Pel W and/or Wo Contrast  Result Date: 04/19/2022 CLINICAL DATA:  Lower GI bleed. Bloody stools since this morning. Abdominal pain. EXAM: CTA ABDOMEN AND PELVIS WITHOUT AND WITH CONTRAST TECHNIQUE: Multidetector CT imaging of the abdomen and  pelvis was performed using the standard protocol during bolus administration of intravenous contrast. Multiplanar reconstructed images and MIPs were obtained and reviewed to evaluate the vascular anatomy. RADIATION DOSE REDUCTION: This exam was performed according to the departmental dose-optimization program which includes automated exposure control, adjustment of the mA and/or kV according to patient size and/or use of iterative reconstruction technique. CONTRAST:  165mL OMNIPAQUE IOHEXOL 350 MG/ML SOLN COMPARISON:  10/21/2020 and 08/14/2019 FINDINGS: VASCULAR Aorta: Unenhanced images demonstrate scattered aortic calcification. Images obtained during arterial phase after contrast administration demonstrate normal caliber abdominal aorta. No dissection. No significant stenosis. Celiac: Patent without evidence of aneurysm, dissection, vasculitis or significant stenosis. SMA: Patent without evidence of aneurysm, dissection, vasculitis or significant stenosis. Renals: Both renal arteries are patent without evidence of aneurysm, dissection, vasculitis, fibromuscular dysplasia or significant stenosis. IMA: Patent without evidence of aneurysm, dissection, vasculitis or significant stenosis. Inflow: Patent without evidence of aneurysm, dissection, vasculitis or significant stenosis. Proximal Outflow: Bilateral common femoral and visualized portions of the superficial and profunda femoral arteries are patent without  evidence of aneurysm, dissection, vasculitis or significant stenosis. Veins: No obvious venous abnormality within the limitations of this arterial phase study. Review of the MIP images confirms the above findings. NON-VASCULAR Lower chest: Lung bases are clear. Hepatobiliary: No focal liver abnormality is seen. No gallstones, gallbladder wall thickening, or biliary dilatation. Pancreas: Unremarkable. No pancreatic ductal dilatation or surrounding inflammatory changes. Spleen: Normal in size without focal abnormality. Adrenals/Urinary Tract: Adrenal glands are unremarkable. Kidneys are normal, without renal calculi, focal lesion, or hydronephrosis. Bladder is unremarkable. Stomach/Bowel: Stomach, small bowel, and colon are not abnormally distended. There is a regional area of wall thickening involving the splenic limb of the transverse colon, splenic flexure, and proximal descending colon. Mild pericolonic stranding. Changes likely to represent colitis. This could reflect diverticulitis, infectious or inflammatory causes. There is a focal area of intraluminal contrast extravasation at the splenic limb of the transverse colon likely indicating the site of gastrointestinal hemorrhage. Diverticulosis of the colon. Appendix is normal. Lymphatic: No significant lymphadenopathy. Reproductive: Prostate is unremarkable. Other: No free air or free fluid in the abdomen. Abdominal wall musculature appears intact. Musculoskeletal: No acute or significant osseous findings. IMPRESSION: VASCULAR Aortic atherosclerosis.  No large vessel occlusion. NON-VASCULAR Diverticulosis of the colon. Wall thickening around the splenic flexure suggesting colitis. Mild stranding around the colon. Focal area of contrast extravasation in the splenic limb of the transverse colon likely representing gastrointestinal hemorrhage from a diverticulum. Electronically Signed   By: Lucienne Capers M.D.   On: 04/19/2022 22:13      Assessment/Plan 1. GI  bleeding   Currently the patient is hemodynamically stable.  We originally planned for embolization today however the patient has eaten.  We have discussed the risk, procedures benefits and alternatives and the patient does agree to proceed with embolization.  2. Elevated BP Recommend monitoring, with no medication adjustments given bleed  Family Communication: None at bedside   Thank you for allowing [place] to participate in the care of this patient.   Kris Hartmann, NP [facility] [phone] (Office Phone) [phone] (Office Fax) [phone] (Pager)  04/20/2022 3:05 PM  Staff may message me via secure chat in [place]  but this may not receive immediate response,  please page for urgent matters!  Dictation software was used to generate the above note. Typos may occur and escape review, as with typed/written notes. Any error is purely unintentional.  Please contact me directly for clarity  if needed.

## 2022-04-20 NOTE — Progress Notes (Signed)
PROGRESS NOTE    Stanley Leach  XBJ:[id number] DOB: 08/12/1971 DOA: 04/19/2022 PCP: Patient, No Pcp Per   Assessment & Plan:   Principal Problem:   GI bleeding Active Problems:   Diverticulosis of intestine with bleeding   Acute colitis   Elevated blood pressure reading   Tobacco abuse  Assessment and Plan: GI bleeding: possibly diverticular in etiology. H&H are WNL. No need for a transfusion currently.  Will go for embolization tomorrow as per IR. GI consulted    Diverticulosis of intestine: with bleeding as per CTA. H&H are WNL. Will continue to monitor. GI consulted    Acute colitis: around the splenic flexure as per CTA. Continue on IV rocephin, flagyl   Tobacco abuse: smoking cessation counseling x 5 mins   Obesity: BMI 35.6. Complicates overall care & prognosis     DVT prophylaxis: SCDs Code Status: full  Family Communication: discussion w/ pt's care w/ pt's family at bedside and answered their questions Disposition Plan: likely d/c back home  Level of care: Progressive  Status is: Inpatient Remains inpatient appropriate because: severity of illness   Consultants:  GI IR  Procedures:  Antimicrobials:    Subjective: Pt c/o bloody stools  Objective: Vitals:   04/19/22 2058 04/19/22 2300 04/20/22 0130 04/20/22 0544  BP: (!) 157/97 (!) 132/92 106/68 129/86  Pulse: 75 71 60 72  Resp: 17 17 17 17   Temp:   97.8 F (36.6 C) 97.8 F (36.6 C)  TempSrc:   Oral   SpO2: 100% 99% 100% 100%  Weight:      Height:       No intake or output data in the 24 hours ending 04/20/22 0838 Filed Weights   04/19/22 1909  Weight: 122.5 kg    Examination:  General exam: Appears calm and comfortable  Respiratory system: Clear to auscultation. Respiratory effort normal. Cardiovascular system: S1 & S2+. No , rubs, gallops or clicks.  Gastrointestinal system: Abdomen is obese, soft and nontender.Normal bowel sounds heard. Central nervous system: Alert and  oriented. Moves all extremities. Psychiatry: Judgement and insight appear normal. Mood & affect appropriate.     Data Reviewed: I have personally reviewed following labs and imaging studies  CBC: Recent Labs  Lab 04/19/22 1912 04/19/22 2151 04/20/22 0620  WBC 9.5  --  8.1  HGB 14.8 13.2 13.2  HCT 44.4 40.3 40.0  MCV 90.4  --  90.3  PLT 380  --  330   Basic Metabolic Panel: Recent Labs  Lab 04/19/22 1912 04/20/22 0620  NA 138 137  K 4.0 3.8  CL 102 105  CO2 27 25  GLUCOSE 105* 109*  BUN 12 11  CREATININE 1.05 0.87  CALCIUM 9.2 8.9   GFR: Estimated Creatinine Clearance: 142.3 mL/min (by C-G formula based on SCr of 0.87 mg/dL). Liver Function Tests: Recent Labs  Lab 04/19/22 1912  AST 18  ALT 23  ALKPHOS 65  BILITOT 0.7  PROT 7.2  ALBUMIN 3.9   No results for input(s): "LIPASE", "AMYLASE" in the last 168 hours. No results for input(s): "AMMONIA" in the last 168 hours. Coagulation Profile: No results for input(s): "INR", "PROTIME" in the last 168 hours. Cardiac Enzymes: No results for input(s): "CKTOTAL", "CKMB", "CKMBINDEX", "TROPONINI" in the last 168 hours. BNP (last 3 results) No results for input(s): "PROBNP" in the last 8760 hours. HbA1C: No results for input(s): "HGBA1C" in the last 72 hours. CBG: No results for input(s): "GLUCAP" in the last 168 hours. Lipid Profile:  No results for input(s): "CHOL", "HDL", "LDLCALC", "TRIG", "CHOLHDL", "LDLDIRECT" in the last 72 hours. Thyroid Function Tests: No results for input(s): "TSH", "T4TOTAL", "FREET4", "T3FREE", "THYROIDAB" in the last 72 hours. Anemia Panel: No results for input(s): "VITAMINB12", "FOLATE", "FERRITIN", "TIBC", "IRON", "RETICCTPCT" in the last 72 hours. Sepsis Labs: No results for input(s): "PROCALCITON", "LATICACIDVEN" in the last 168 hours.  No results found for this or any previous visit (from the past 240 hour(s)).       Radiology Studies: CT Angio Abd/Pel W and/or Wo  Contrast  Result Date: 04/19/2022 CLINICAL DATA:  Lower GI bleed. Bloody stools since this morning. Abdominal pain. EXAM: CTA ABDOMEN AND PELVIS WITHOUT AND WITH CONTRAST TECHNIQUE: Multidetector CT imaging of the abdomen and pelvis was performed using the standard protocol during bolus administration of intravenous contrast. Multiplanar reconstructed images and MIPs were obtained and reviewed to evaluate the vascular anatomy. RADIATION DOSE REDUCTION: This exam was performed according to the departmental dose-optimization program which includes automated exposure control, adjustment of the mA and/or kV according to patient size and/or use of iterative reconstruction technique. CONTRAST:  OMNIPAQUE IOHEXOL 350 MG/ML SOLN COMPARISON:  10/21/2020 and 08/14/2019 FINDINGS: VASCULAR Aorta: Unenhanced images demonstrate scattered aortic calcification. Images obtained during arterial phase after contrast administration demonstrate normal caliber abdominal aorta. No dissection. No significant stenosis. Celiac: Patent without evidence of aneurysm, dissection, vasculitis or significant stenosis. SMA: Patent without evidence of aneurysm, dissection, vasculitis or significant stenosis. Renals: Both renal arteries are patent without evidence of aneurysm, dissection, vasculitis, fibromuscular dysplasia or significant stenosis. IMA: Patent without evidence of aneurysm, dissection, vasculitis or significant stenosis. Inflow: Patent without evidence of aneurysm, dissection, vasculitis or significant stenosis. Proximal Outflow: Bilateral common femoral and visualized portions of the superficial and profunda femoral arteries are patent without evidence of aneurysm, dissection, vasculitis or significant stenosis. Veins: No obvious venous abnormality within the limitations of this arterial phase study. Review of the MIP images confirms the above findings. NON-VASCULAR Lower chest: Lung bases are clear. Hepatobiliary: No focal  liver abnormality is seen. No gallstones, gallbladder wall thickening, or biliary dilatation. Pancreas: Unremarkable. No pancreatic ductal dilatation or surrounding inflammatory changes. Spleen: Normal in size without focal abnormality. Adrenals/Urinary Tract: Adrenal glands are unremarkable. Kidneys are normal, without renal calculi, focal lesion, or hydronephrosis. Bladder is unremarkable. Stomach/Bowel: Stomach, small bowel, and colon are not abnormally distended. There is a regional area of wall thickening involving the splenic limb of the transverse colon, splenic flexure, and proximal descending colon. Mild pericolonic stranding. Changes likely to represent colitis. This could reflect diverticulitis, infectious or inflammatory causes. There is a focal area of intraluminal contrast extravasation at the splenic limb of the transverse colon likely indicating the site of gastrointestinal hemorrhage. Diverticulosis of the colon. Appendix is normal. Lymphatic: No significant lymphadenopathy. Reproductive: Prostate is unremarkable. Other: No free air or free fluid in the abdomen. Abdominal wall musculature appears intact. Musculoskeletal: No acute or significant osseous findings. IMPRESSION: VASCULAR Aortic atherosclerosis.  No large vessel occlusion. NON-VASCULAR Diverticulosis of the colon. Wall thickening around the splenic flexure suggesting colitis. Mild stranding around the colon. Focal area of contrast extravasation in the splenic limb of the transverse colon likely representing gastrointestinal hemorrhage from a diverticulum. Electronically Signed   By: Burman Nieves M.D.   On: 04/19/2022 22:13        Scheduled Meds:  Continuous Infusions:  sodium chloride 100 mL/hr at 04/20/22 0545     LOS: 1 day    Time spent: 35 mins  Charise Killian, MD [facility] Pager [phone]  If 7PM-7AM, please contact night-coverage [email or web address] 04/20/2022, 8:38 AM

## 2022-04-20 NOTE — Consult Note (Signed)
Lucilla Lame, MD [facility]  [address], [place], [place] [address] Phone: [phone] Fax : [phone]  Consultation  Referring Provider:     Dr. Sidney Ace Primary Care Physician:  Patient, No Pcp Per Primary Gastroenterologist: Althia Forts         Reason for Consultation:     Lower GI bleed  Date of Admission:  04/19/2022 Date of Consultation:  04/20/2022         HPI:   Stanley Leach is a 51 y.o. male who was admitted with a GI bleed.  The patient reported that he started to notice bright red blood per rectum when he wiped and then had a large bowel movement where he passed nothing but bright red blood.  The patient had a CT angiography that showed:  IMPRESSION: VASCULAR   Aortic atherosclerosis.  No large vessel occlusion.   NON-VASCULAR   Diverticulosis of the colon. Wall thickening around the splenic flexure suggesting colitis. Mild stranding around the colon. Focal area of contrast extravasation in the splenic limb of the transverse colon likely representing gastrointestinal hemorrhage from a diverticulum.  The patient does have a history of hemorrhoids and denies any NSAID use or alcohol abuse.  The patient's hemoglobin trends have shown:  Component     Latest Ref Rng 10/21/2020 04/19/2022 04/20/2022  Hemoglobin     13.0 - 17.0 g/dL 15.6  13.2  13.2   Hemoglobin       14.8    HCT     39.0 - 52.0 % 47.0  40.3  40.0   HCT       44.4     The patient reports that he had a bowel movement since being in the ER and reports that he did pass a clot but much less bleeding than he had previously.  The patient denies ever having a colonoscopy in the past.  He also denies having a primary care provider nor does he report that he has ever seen a gastroenterologist.  No past medical history on file.  No past surgical history on file.  Prior to Admission medications   Not on File    No family history on file.   Social History   Tobacco Use   Smoking  status: Heavy Smoker    Packs/day: 0.50    Types: Cigarettes   Smokeless tobacco: Never  Substance Use Topics   Alcohol use: Yes   Drug use: No    Allergies as of 04/19/2022   (No Known Allergies)    Review of Systems:    All systems reviewed and negative except where noted in HPI.   Physical Exam:  Vital signs in last 24 hours: Temp:  [97.8 F (36.6 C)-99 F (37.2 C)] 98.6 F (37 C) (07/31 1748) Pulse Rate:  [60-80] 70 (07/31 1748) Resp:  [16-20] 16 (07/31 1748) BP: (106-157)/(68-99) 121/80 (07/31 1748) SpO2:  [96 %-100 %] 97 % (07/31 1748) Weight:  [122.5 kg] 122.5 kg (07/30 1909) Last BM Date : 04/20/22 General:   Pleasant, cooperative in NAD Head:  Normocephalic and atraumatic. Eyes:   No icterus.   Conjunctiva pink. PERRLA. Ears:  Normal auditory acuity. Neck:  Supple; no masses or thyroidomegaly Lungs: Respirations even and unlabored. Lungs clear to auscultation bilaterally.   No wheezes, crackles, or rhonchi.  Heart:  Regular rate and rhythm;  Without murmur, clicks, rubs or gallops Abdomen:  Soft, nondistended, nontender. Normal bowel sounds. No appreciable masses or hepatomegaly.  No rebound or  guarding.  Rectal:  Not performed. Msk:  Symmetrical without gross deformities.   Extremities:  Without edema, cyanosis or clubbing. Neurologic:  Alert and oriented x3;  grossly normal neurologically. Skin:  Intact without significant lesions or rashes. Cervical Nodes:  No significant cervical adenopathy. Psych:  Alert and cooperative. Normal affect.  LAB RESULTS: Recent Labs    04/19/22 1912 04/19/22 2151 04/20/22 0620 04/20/22 1306  WBC 9.5  --  8.1  --   HGB 14.8 13.2 13.2 12.8*  HCT 44.4 40.3 40.0 38.9*  PLT 380  --  330  --    BMET Recent Labs    04/19/22 1912 04/20/22 0620  NA 138 137  K 4.0 3.8  CL 102 105  CO2 27 25  GLUCOSE 105* 109*  BUN 12 11  CREATININE 1.05 0.87  CALCIUM 9.2 8.9   LFT Recent Labs    04/19/22 1912  PROT 7.2   ALBUMIN 3.9  AST 18  ALT 23  ALKPHOS 65  BILITOT 0.7   PT/INR No results for input(s): "LABPROT", "INR" in the last 72 hours.  STUDIES: CT Angio Abd/Pel W and/or Wo Contrast  Result Date: 04/19/2022 CLINICAL DATA:  Lower GI bleed. Bloody stools since this morning. Abdominal pain. EXAM: CTA ABDOMEN AND PELVIS WITHOUT AND WITH CONTRAST TECHNIQUE: Multidetector CT imaging of the abdomen and pelvis was performed using the standard protocol during bolus administration of intravenous contrast. Multiplanar reconstructed images and MIPs were obtained and reviewed to evaluate the vascular anatomy. RADIATION DOSE REDUCTION: This exam was performed according to the departmental dose-optimization program which includes automated exposure control, adjustment of the mA and/or kV according to patient size and/or use of iterative reconstruction technique. CONTRAST:  OMNIPAQUE IOHEXOL 350 MG/ML SOLN COMPARISON:  10/21/2020 and 08/14/2019 FINDINGS: VASCULAR Aorta: Unenhanced images demonstrate scattered aortic calcification. Images obtained during arterial phase after contrast administration demonstrate normal caliber abdominal aorta. No dissection. No significant stenosis. Celiac: Patent without evidence of aneurysm, dissection, vasculitis or significant stenosis. SMA: Patent without evidence of aneurysm, dissection, vasculitis or significant stenosis. Renals: Both renal arteries are patent without evidence of aneurysm, dissection, vasculitis, fibromuscular dysplasia or significant stenosis. IMA: Patent without evidence of aneurysm, dissection, vasculitis or significant stenosis. Inflow: Patent without evidence of aneurysm, dissection, vasculitis or significant stenosis. Proximal Outflow: Bilateral common femoral and visualized portions of the superficial and profunda femoral arteries are patent without evidence of aneurysm, dissection, vasculitis or significant stenosis. Veins: No obvious venous abnormality  within the limitations of this arterial phase study. Review of the MIP images confirms the above findings. NON-VASCULAR Lower chest: Lung bases are clear. Hepatobiliary: No focal liver abnormality is seen. No gallstones, gallbladder wall thickening, or biliary dilatation. Pancreas: Unremarkable. No pancreatic ductal dilatation or surrounding inflammatory changes. Spleen: Normal in size without focal abnormality. Adrenals/Urinary Tract: Adrenal glands are unremarkable. Kidneys are normal, without renal calculi, focal lesion, or hydronephrosis. Bladder is unremarkable. Stomach/Bowel: Stomach, small bowel, and colon are not abnormally distended. There is a regional area of wall thickening involving the splenic limb of the transverse colon, splenic flexure, and proximal descending colon. Mild pericolonic stranding. Changes likely to represent colitis. This could reflect diverticulitis, infectious or inflammatory causes. There is a focal area of intraluminal contrast extravasation at the splenic limb of the transverse colon likely indicating the site of gastrointestinal hemorrhage. Diverticulosis of the colon. Appendix is normal. Lymphatic: No significant lymphadenopathy. Reproductive: Prostate is unremarkable. Other: No free air or free fluid in the abdomen. Abdominal wall musculature  appears intact. Musculoskeletal: No acute or significant osseous findings. IMPRESSION: VASCULAR Aortic atherosclerosis.  No large vessel occlusion. NON-VASCULAR Diverticulosis of the colon. Wall thickening around the splenic flexure suggesting colitis. Mild stranding around the colon. Focal area of contrast extravasation in the splenic limb of the transverse colon likely representing gastrointestinal hemorrhage from a diverticulum. Electronically Signed   By: Burman Nieves M.D.   On: 04/19/2022 22:13      Impression / Plan:   Assessment: Principal Problem:   GI bleeding Active Problems:   Diverticulosis of intestine with  bleeding   Tobacco abuse   Elevated blood pressure reading   Acute colitis   GI bleed   Stanley Leach is a 51 y.o. y/o male with lower GI bleeding and a CT scan suggestive of a diverticular bleed.  The patient has been evaluated by vascular surgery who recommends the patient undergo a vascular study tomorrow.  Plan: The patient has been told that he should undergo a colonoscopy at sometime in the future to make sure there is nothing else going on but with his acute bleed and limited ability to stop diverticular bleeding via a colonoscopy he has been told to first get over this event and then he has been given my card to call my office and set up for an outpatient colonoscopy.  The patient has been explained the plan and agrees with it.  Thank you for involving me in the care of this patient.      LOS: 1 day   Midge Minium, MD, [facility] 04/20/2022, 6:42 PM,  Pager [phone] 7am-5pm  Check AMION for 5pm -7am coverage and on weekends   Note: This dictation was prepared with Dragon dictation along with smaller phrase technology. Any transcriptional errors that result from this process are unintentional.

## 2022-04-20 NOTE — Assessment & Plan Note (Signed)
-   This could be related to initial bleeding. - We will continue monitoring his blood pressure.

## 2022-04-21 ENCOUNTER — Ambulatory Visit: Admit: 2022-04-21 | Payer: Self-pay | Admitting: Vascular Surgery

## 2022-04-21 LAB — BASIC METABOLIC PANEL
Anion gap: 6 (ref 5–15)
BUN: 12 mg/dL (ref 6–20)
CO2: 26 mmol/L (ref 22–32)
Calcium: 8.7 mg/dL — ABNORMAL LOW (ref 8.9–10.3)
Chloride: 108 mmol/L (ref 98–111)
Creatinine, Ser: 0.97 mg/dL (ref 0.61–1.24)
GFR, Estimated: >60 mL/min (ref >60)
Glucose, Bld: 121 mg/dL — ABNORMAL HIGH (ref 70–99)
Potassium: 4.1 mmol/L (ref 3.5–5.1)
Sodium: 140 mmol/L (ref 135–145)

## 2022-04-21 LAB — CBC
HCT: 41.3 % (ref 39.0–52.0)
Hemoglobin: 13.8 g/dL (ref 13.0–17.0)
MCH: 30.5 pg (ref 26.0–34.0)
MCHC: 33.4 g/dL (ref 30.0–36.0)
MCV: 91.4 fL (ref 80.0–100.0)
Platelets: 342 10*3/uL (ref 150–400)
RBC: 4.52 MIL/uL (ref 4.22–5.81)
RDW: 12.4 % (ref 11.5–15.5)
WBC: 7.7 10*3/uL (ref 4.0–10.5)
nRBC: 0 % (ref 0.0–0.2)

## 2022-04-21 LAB — HIV ANTIBODY (ROUTINE TESTING W REFLEX): HIV Screen 4th Generation wRfx: NONREACTIVE

## 2022-04-21 SURGERY — EMBOLIZATION
Anesthesia: Moderate Sedation

## 2022-04-21 MED ORDER — AMOXICILLIN-POT CLAVULANATE 875-125 MG PO TABS: 1.0000 | ORAL_TABLET | Freq: Two times a day (BID) | ORAL | 0 refills | Status: AC

## 2022-04-21 NOTE — Care Management (Signed)
  Transition of Care [facility]) Screening Note   Patient Details  Name: Stanley Leach Date of Birth: 04-Sep-1971   Transition of Care [facility]) CM/SW Contact:    Caryn Section, RN Phone Number: 04/21/2022, 2:04 PM    Transition of Care Department [facility]) has reviewed patient and no TOC needs have been identified at this time. We will continue to monitor patient advancement through interdisciplinary progression rounds. If new patient transition needs arise, please place a TOC consult.

## 2022-04-21 NOTE — Progress Notes (Signed)
Pt A/Ox4 upon review of AVS. Augmentin sent to correct pharmacy and advised to start tonight per MD. PIV removed. Patient ambulatory. No further needs.

## 2022-04-21 NOTE — Discharge Summary (Signed)
Physician Discharge Summary  Stanley Leach:[id number] DOB: 09-Jan-1971 DOA: 04/19/2022  PCP: Patient, No Pcp Per  Admit date: 04/19/2022 Discharge date: 04/21/2022  Admitted From: home  Disposition:  home   Recommendations for Outpatient Follow-up:  Follow up with PCP in 1-2 weeks F/u w/ GI, Dr. Servando Snare, in 1-2 weeks  Home Health: no  Equipment/Devices:  Discharge Condition: stable  CODE STATUS: full  Diet recommendation: Regular  Brief/Interim Summary:   HPI was taken from Dr. Arville Care: Stanley Leach is a 51 y.o. male with medical history significant for tobacco abuse, presented to the emergency room with acute onset of bright red bleeding per rectum that started before he came to the ER and has been recurrent in the waiting room as well as in the ER.  He noticed some blood clots as well.  He denied any melena or bright red bleeding per rectum.  No nausea or vomiting however he had some abdominal cramps with associated diarrhea.  No fever or chills.  No dyspnea or cough or wheezing.  No dysuria, oliguria or hematuria or flank pain.  No chest pain or palpitations.  No other bleeding diathesis.  He denied taking any recent antibiotics.   ED Course: When he came to the ER, BP was 147/93 and later 113/74 at that she was 99 with otherwise normal vital signs.  Labs revealed unremarkable CMP and CBC showed hemoglobin of 14.8 and hematocrit 44.4.  Repeat H&H in 3 hours were 13.2 and 40.3.   Imaging: CT angiography of the abdomen pelvis revealed diverticulosis of the colon with wall thickening around the splenic flexure suggesting colitis as well as mild stranding around the colon with focal area of contrast extravasation in the splenic limb of the transverse colon likely presenting with GI hemorrhage from a diverticulum.  The patient was typed and screened and was given 1 L bolus of IV lactated Ringer.  Contact was made with vascular surgery here and at [facility] as well as  interventional radiology.  IR will be willing to intervene if need be if he continues to have recurrent bleeding tonight.  All were agreeable with admission here at [facility].  He will be admitted to a progressive unit bed for further evaluation and management.    As per Dr. Mayford Knife 7/31-04/21/22: Pt presented w/ GI bleeding likely secondary to diverticular bleed. CTA abd/pelvis that showed likely colitis, a focal area of intraluminal contrast extravasation at the splenic limb of the transverse colon likely indicating the site of GI hemorrhage. GI and IR were consulted. Pt will f/u outpatient w/ GI for a colonoscopy. Pt was initially scheduled for a embolization on 7/31 but the pt ate that day so the procedure was pushed to 04/21/22. Pt's bleeding had stopped so the embolization was not done on 04/21/22 as per IR. Pt was tolerating a po diet prior to d/c. Pt was told to return to the ER if the bleeding reoccurred. Pt verbalized his understanding    Discharge Diagnoses:  Principal Problem:   GI bleeding Active Problems:   Diverticulosis of intestine with bleeding   Acute colitis   Elevated blood pressure reading   Tobacco abuse   GI bleed  GI bleeding: possibly diverticular in etiology. H&H are WNL. No need for a transfusion currently.  No embolization today as the bleeding had stopped as per IR.    Diverticulosis of intestine: with bleeding as per CTA. H&H are WNL. Will continue to monitor. GI consulted    Acute  colitis: around the splenic flexure as per CTA. Continue on IV rocephin, flagyl while inpatient and was d/c on po augmentin to complete the course. Will f/u outpatient w/ GI, Dr. Servando Snare, for colonoscopy    Tobacco abuse: smoking cessation counseling x 5 mins   Obesity: BMI 35.6. Complicates overall care & prognosis   Discharge Instructions  Discharge Instructions     Diet general   Complete by: As directed    Discharge instructions   Complete by: As directed    F/u w/ PCP in 1-2 weeks.  F/u w/ GI, Dr. Servando Snare, in 1-2 weeks   Increase activity slowly   Complete by: As directed       Allergies as of 04/21/2022   No Known Allergies      Medication List     TAKE these medications    amoxicillin-clavulanate 875-125 MG tablet Commonly known as: AUGMENTIN Take 1 tablet by mouth 2 (two) times daily for 4 days.        Follow-up Information     Midge Minium, MD Follow up.   Specialty: Gastroenterology Why: F/u w/ GI in 1-2 weeks Contact information: [address] [place]  [place] [address] [phone]                No Known Allergies  Consultations: IR GI   Procedures/Studies: CT Angio Abd/Pel W and/or Wo Contrast  Result Date: 04/19/2022 CLINICAL DATA:  Lower GI bleed. Bloody stools since this morning. Abdominal pain. EXAM: CTA ABDOMEN AND PELVIS WITHOUT AND WITH CONTRAST TECHNIQUE: Multidetector CT imaging of the abdomen and pelvis was performed using the standard protocol during bolus administration of intravenous contrast. Multiplanar reconstructed images and MIPs were obtained and reviewed to evaluate the vascular anatomy. RADIATION DOSE REDUCTION: This exam was performed according to the departmental dose-optimization program which includes automated exposure control, adjustment of the mA and/or kV according to patient size and/or use of iterative reconstruction technique. CONTRAST:  OMNIPAQUE IOHEXOL 350 MG/ML SOLN COMPARISON:  10/21/2020 and 08/14/2019 FINDINGS: VASCULAR Aorta: Unenhanced images demonstrate scattered aortic calcification. Images obtained during arterial phase after contrast administration demonstrate normal caliber abdominal aorta. No dissection. No significant stenosis. Celiac: Patent without evidence of aneurysm, dissection, vasculitis or significant stenosis. SMA: Patent without evidence of aneurysm, dissection, vasculitis or significant stenosis. Renals: Both renal arteries are patent without evidence of aneurysm, dissection,  vasculitis, fibromuscular dysplasia or significant stenosis. IMA: Patent without evidence of aneurysm, dissection, vasculitis or significant stenosis. Inflow: Patent without evidence of aneurysm, dissection, vasculitis or significant stenosis. Proximal Outflow: Bilateral common femoral and visualized portions of the superficial and profunda femoral arteries are patent without evidence of aneurysm, dissection, vasculitis or significant stenosis. Veins: No obvious venous abnormality within the limitations of this arterial phase study. Review of the MIP images confirms the above findings. NON-VASCULAR Lower chest: Lung bases are clear. Hepatobiliary: No focal liver abnormality is seen. No gallstones, gallbladder wall thickening, or biliary dilatation. Pancreas: Unremarkable. No pancreatic ductal dilatation or surrounding inflammatory changes. Spleen: Normal in size without focal abnormality. Adrenals/Urinary Tract: Adrenal glands are unremarkable. Kidneys are normal, without renal calculi, focal lesion, or hydronephrosis. Bladder is unremarkable. Stomach/Bowel: Stomach, small bowel, and colon are not abnormally distended. There is a regional area of wall thickening involving the splenic limb of the transverse colon, splenic flexure, and proximal descending colon. Mild pericolonic stranding. Changes likely to represent colitis. This could reflect diverticulitis, infectious or inflammatory causes. There is a focal area of intraluminal contrast extravasation at the splenic  limb of the transverse colon likely indicating the site of gastrointestinal hemorrhage. Diverticulosis of the colon. Appendix is normal. Lymphatic: No significant lymphadenopathy. Reproductive: Prostate is unremarkable. Other: No free air or free fluid in the abdomen. Abdominal wall musculature appears intact. Musculoskeletal: No acute or significant osseous findings. IMPRESSION: VASCULAR Aortic atherosclerosis.  No large vessel occlusion. NON-VASCULAR  Diverticulosis of the colon. Wall thickening around the splenic flexure suggesting colitis. Mild stranding around the colon. Focal area of contrast extravasation in the splenic limb of the transverse colon likely representing gastrointestinal hemorrhage from a diverticulum. Electronically Signed   By: Burman Nieves M.D.   On: 04/19/2022 22:13   (Echo, Carotid, EGD, Colonoscopy, ERCP)    Subjective: pt denies any complaints    Discharge Exam: Vitals:   04/21/22 0428 04/21/22 0839  BP: 111/80 118/82  Pulse: 76 (!) 57  Resp: 17 18  Temp: (!) 97.5 F (36.4 C) 97.9 F (36.6 C)  SpO2: 97% 97%   Vitals:   04/20/22 1748 04/20/22 2052 04/21/22 0428 04/21/22 0839  BP: 121/80 123/86 111/80 118/82  Pulse: 70 80 76 (!) 57  Resp: 16 18 17 18   Temp: 98.6 F (37 C) 98.2 F (36.8 C) (!) 97.5 F (36.4 C) 97.9 F (36.6 C)  TempSrc:      SpO2: 97% 98% 97% 97%  Weight:      Height:        General: Pt is alert, awake, not in acute distress Cardiovascular: S1/S2 +, no rubs, no gallops Respiratory: CTA bilaterally, no wheezing, no rhonchi Abdominal: Soft, NT, obese, bowel sounds + Extremities: no edema, no cyanosis    The results of significant diagnostics from this hospitalization (including imaging, microbiology, ancillary and laboratory) are listed below for reference.     Microbiology: No results found for this or any previous visit (from the past 240 hour(s)).   Labs: BNP (last 3 results) No results for input(s): "BNP" in the last 8760 hours. Basic Metabolic Panel: Recent Labs  Lab 04/19/22 1912 04/20/22 0620 04/21/22 0520  NA 138 137 140  K 4.0 3.8 4.1  CL 102 105 108  CO2 27 25 26   GLUCOSE 105* 109* 121*  BUN 12 11 12   CREATININE 1.05 0.87 0.97  CALCIUM 9.2 8.9 8.7*   Liver Function Tests: Recent Labs  Lab 04/19/22 1912  AST 18  ALT 23  ALKPHOS 65  BILITOT 0.7  PROT 7.2  ALBUMIN 3.9   No results for input(s): "LIPASE", "AMYLASE" in the last 168  hours. No results for input(s): "AMMONIA" in the last 168 hours. CBC: Recent Labs  Lab 04/19/22 1912 04/19/22 2151 04/20/22 0620 04/20/22 1306 04/21/22 0520  WBC 9.5  --  8.1  --  7.7  HGB 14.8 13.2 13.2 12.8* 13.8  HCT 44.4 40.3 40.0 38.9* 41.3  MCV 90.4  --  90.3  --  91.4  PLT 380  --  330  --  342   Cardiac Enzymes: No results for input(s): "CKTOTAL", "CKMB", "CKMBINDEX", "TROPONINI" in the last 168 hours. BNP: Invalid input(s): "POCBNP" CBG: No results for input(s): "GLUCAP" in the last 168 hours. D-Dimer No results for input(s): "DDIMER" in the last 72 hours. Hgb A1c No results for input(s): "HGBA1C" in the last 72 hours. Lipid Profile No results for input(s): "CHOL", "HDL", "LDLCALC", "TRIG", "CHOLHDL", "LDLDIRECT" in the last 72 hours. Thyroid function studies No results for input(s): "TSH", "T4TOTAL", "T3FREE", "THYROIDAB" in the last 72 hours.  Invalid input(s): "FREET3" Anemia work up  No results for input(s): "VITAMINB12", "FOLATE", "FERRITIN", "TIBC", "IRON", "RETICCTPCT" in the last 72 hours. Urinalysis    Component Value Date/Time   COLORURINE YELLOW (A) 10/21/2020 1126   APPEARANCEUR CLEAR (A) 10/21/2020 1126   APPEARANCEUR Clear 09/28/2013 1127   LABSPEC 1.017 10/21/2020 1126   LABSPEC 1.012 09/28/2013 1127   PHURINE 5.0 10/21/2020 1126   GLUCOSEU NEGATIVE 10/21/2020 1126   GLUCOSEU Negative 09/28/2013 1127   HGBUR NEGATIVE 10/21/2020 1126   BILIRUBINUR NEGATIVE 10/21/2020 1126   BILIRUBINUR Negative 09/28/2013 1127   KETONESUR NEGATIVE 10/21/2020 1126   PROTEINUR NEGATIVE 10/21/2020 1126   NITRITE NEGATIVE 10/21/2020 1126   LEUKOCYTESUR TRACE (A) 10/21/2020 1126   LEUKOCYTESUR Trace 09/28/2013 1127   Sepsis Labs Recent Labs  Lab 04/19/22 1912 04/20/22 0620 04/21/22 0520  WBC 9.5 8.1 7.7   Microbiology No results found for this or any previous visit (from the past 240 hour(s)).   Time coordinating discharge: Over 30  minutes  SIGNED:   Charise Killian, MD  [facility] 04/21/2022, 1:45 PM Pager   If 7PM-7AM, please contact night-coverage [email or web address]

## 2022-05-12 IMAGING — CT CT RENAL STONE PROTOCOL
2 of 4 series · 16 of 46 positions shown, 18 images · non-contrast
Comparison: CT abdomen pelvis August 14, 2019

CLINICAL DATA: Left flank pain and left lower quadrant pain onset
today with nausea. No history of renal stones.

EXAM:
CT ABDOMEN AND PELVIS WITHOUT CONTRAST
TECHNIQUE: Multidetector CT imaging of the abdomen and pelvis was performed
following the standard protocol without IV contrast.

[Series 2: stone full standard · axial · 0.84mm/px · z∈[-1038,-548]mm · 13 of 108 slices shown, 15 images]
[im 5/108  soft-tissue]
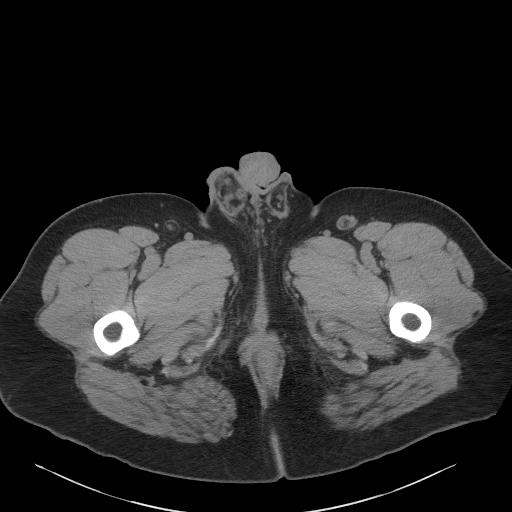
[im 5/108  bone]
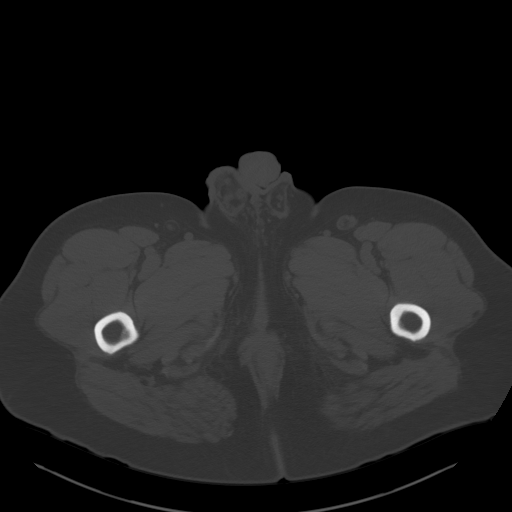
[im 13/108  soft-tissue]
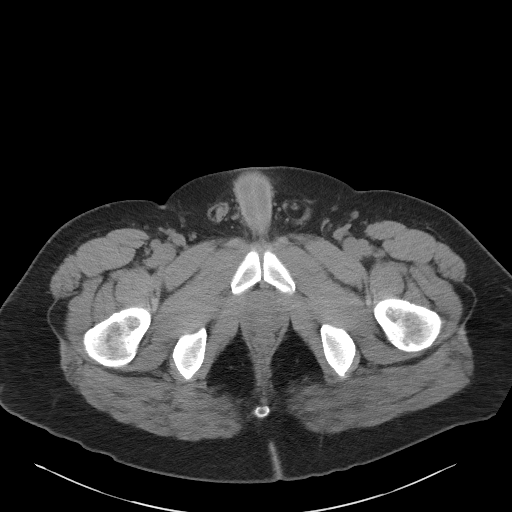
[im 22/108  soft-tissue]
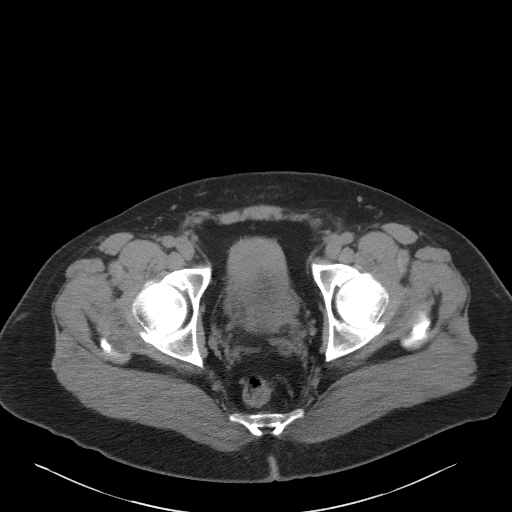
[im 30/108  soft-tissue]
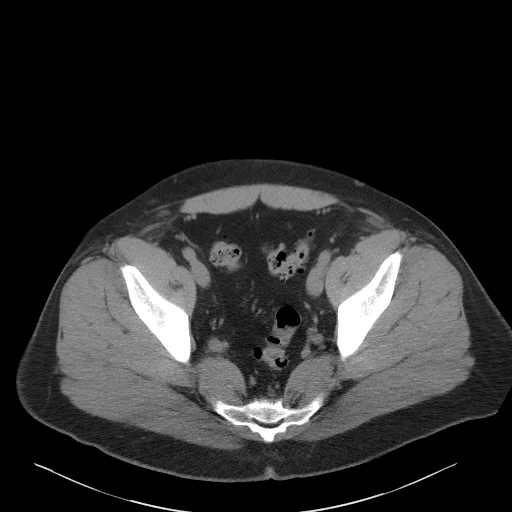
[im 39/108  soft-tissue]
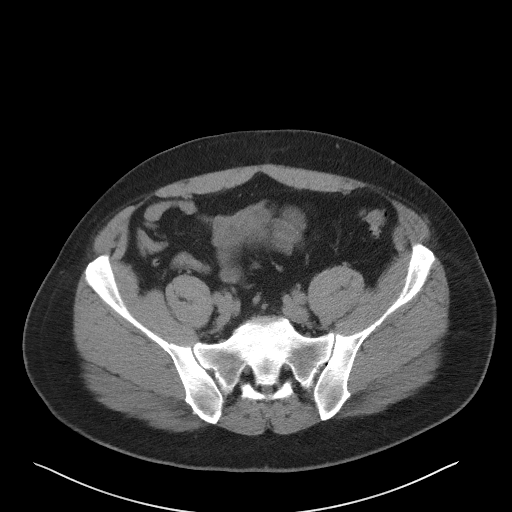
[im 48/108  soft-tissue]
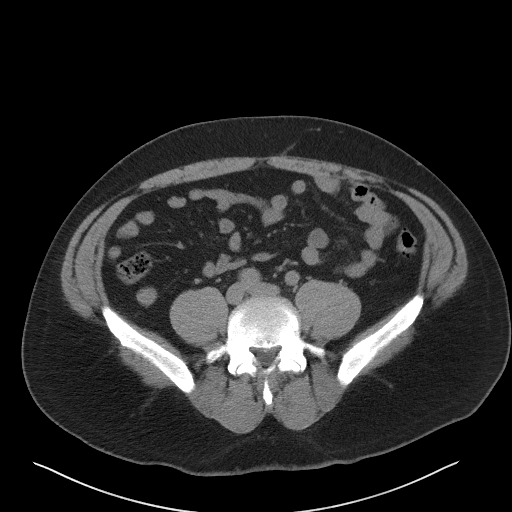
[im 56/108  soft-tissue]
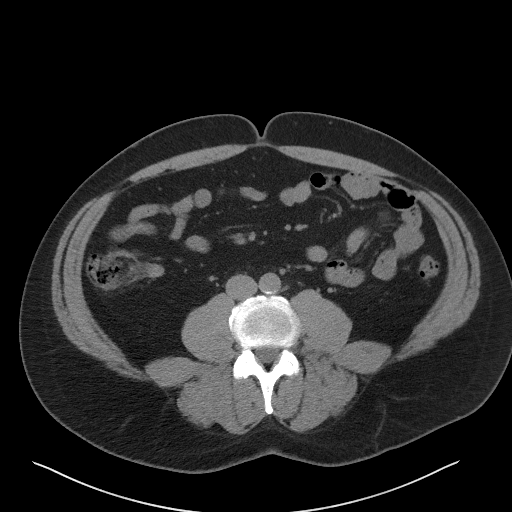
[im 60/108  soft-tissue]
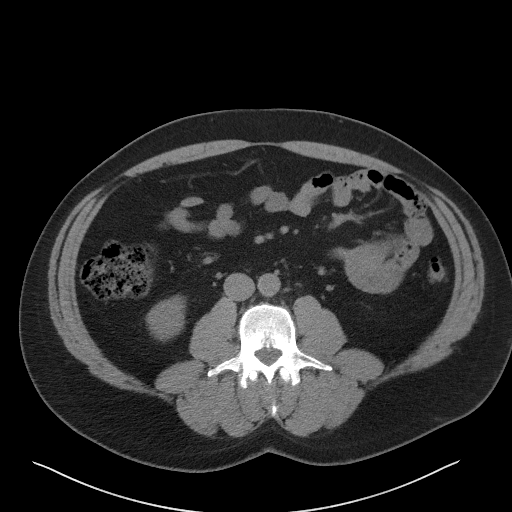
[im 69/108  soft-tissue]
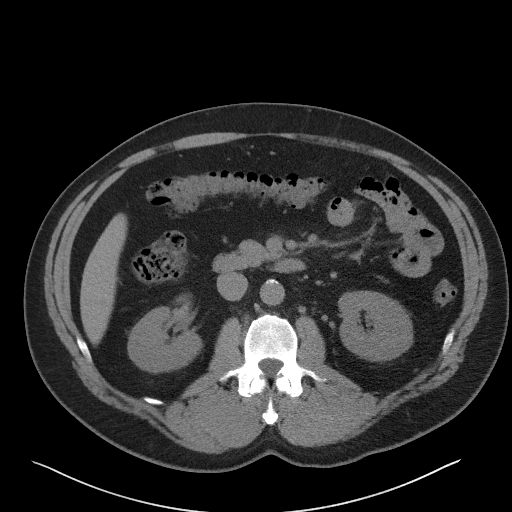
[im 69/108  bone]
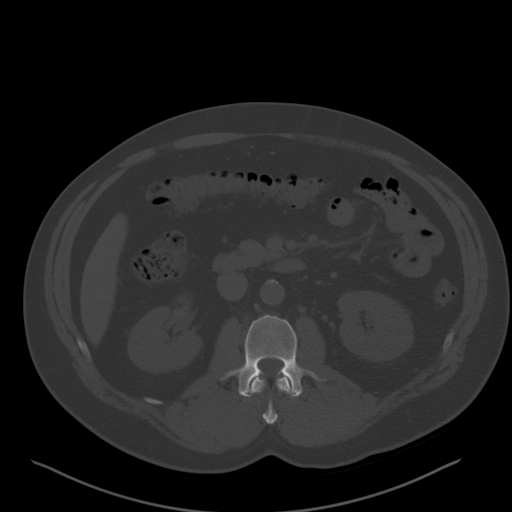
[im 78/108  soft-tissue]
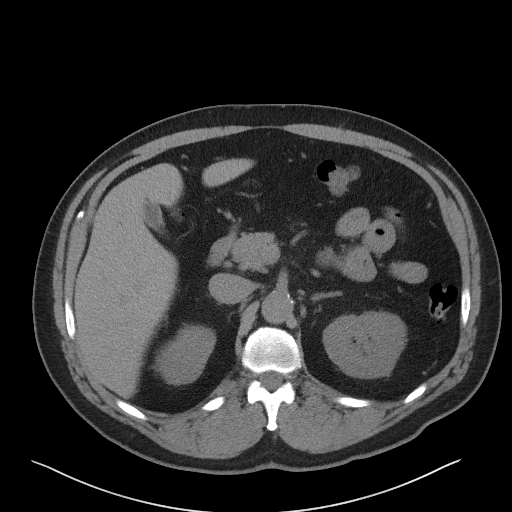
[im 86/108  soft-tissue]
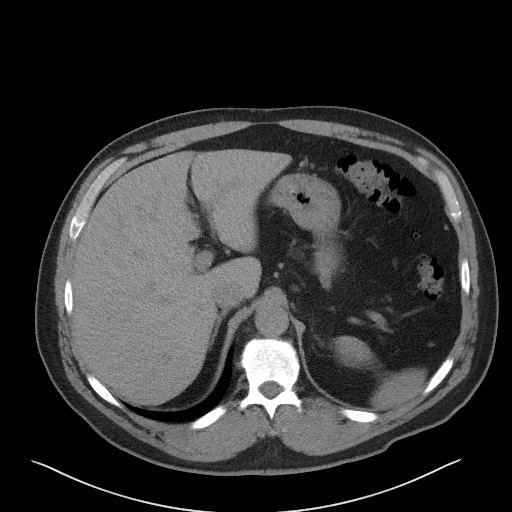
[im 95/108  soft-tissue]
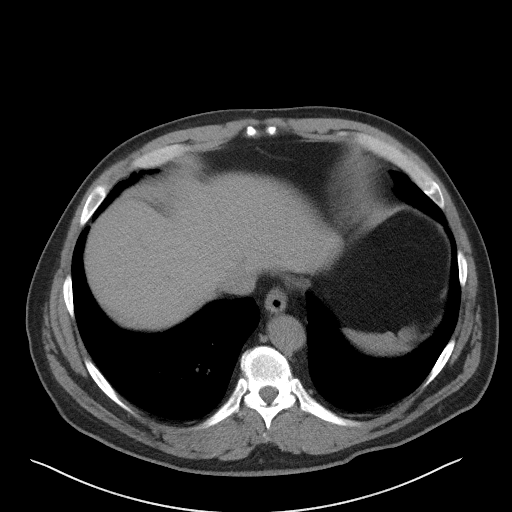
[im 103/108  soft-tissue]
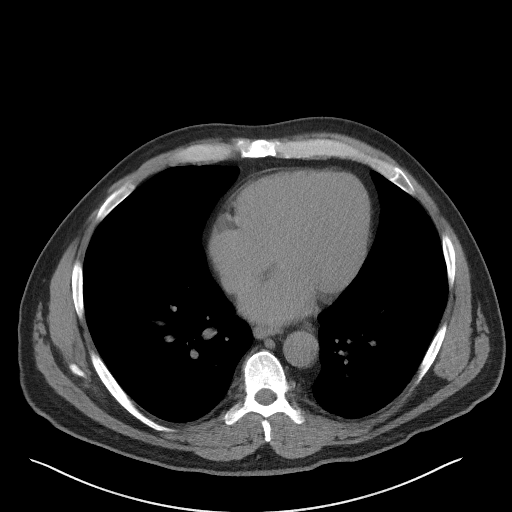

[Series 5: coronal · coronal · 0.91mm/px · 3 of 167 slices shown]
[im 56/167  soft-tissue]
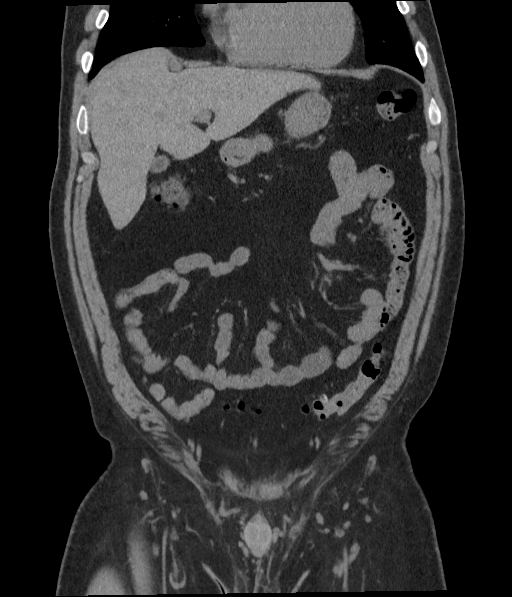
[im 74/167  soft-tissue]
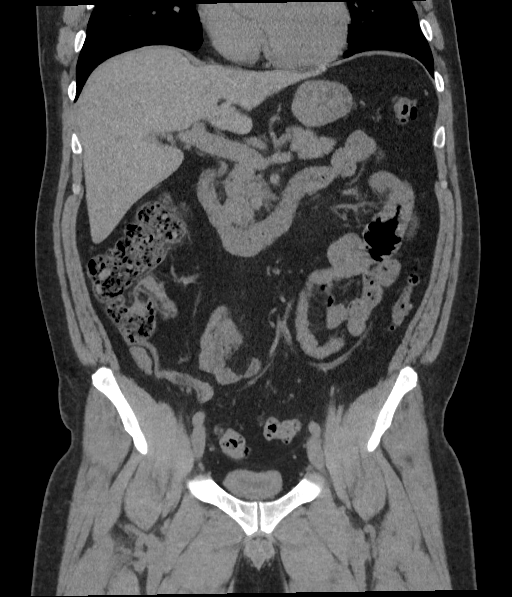
[im 93/167  soft-tissue]
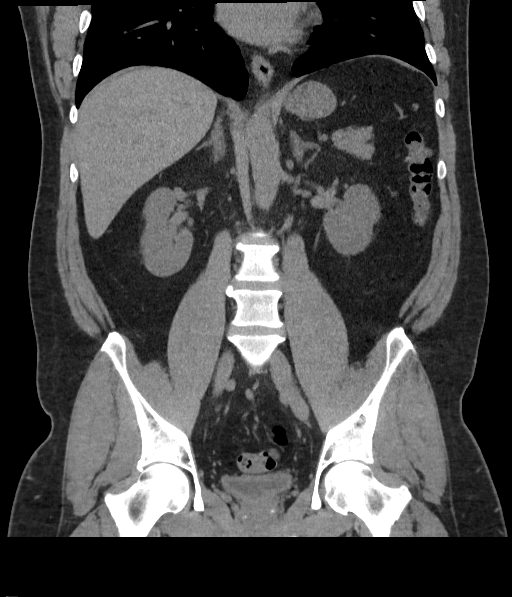

[16 of 46 positions shown; findings below may reference images not displayed]

FINDINGS: Lower chest: No acute abnormality.

Hepatobiliary: No focal liver abnormality is seen. No gallstones,
gallbladder wall thickening, or biliary dilatation.

Pancreas: Unremarkable. No pancreatic ductal dilatation or
surrounding inflammatory changes.

Spleen: Normal in size without focal abnormality.

Adrenals/Urinary Tract: Adrenal glands are unremarkable. Kidneys are
normal, without renal calculi, focal lesion, or hydronephrosis.
Bladder is predominantly decompressed limiting evaluation.

Stomach/Bowel: Stomach is predominantly decompressed limiting
evaluation. Anatomic positioning of the duodenum/ligament of Treitz.
Terminal ileum and appendix are within normal limits. Descending and
sigmoid colonic diverticulosis without findings of acute
diverticulitis.

Vascular/Lymphatic: Aortic atherosclerosis. No enlarged abdominal or
pelvic lymph nodes.

Reproductive: Prostatic calcifications.

Other: No abdominopelvic ascites.

Musculoskeletal: Multilevel degenerative changes spine. No acute
osseous abnormality.
IMPRESSION: 1. No acute abdominopelvic findings. Specifically, no evidence of
hydronephrosis or nephrolithiasis.
2. Descending and sigmoid colonic diverticulosis without findings of
acute diverticulitis.
3. Aortic atherosclerosis.

Aortic Atherosclerosis ([id number]).

## 2023-04-21 ENCOUNTER — Other Ambulatory Visit: Payer: Self-pay | Admitting: Podiatry

## 2023-04-21 DIAGNOSIS — M7661 Achilles tendinitis, right leg: Secondary | ICD-10-CM

## 2023-04-28 ENCOUNTER — Encounter: Payer: Self-pay | Admitting: Podiatry

## 2023-05-01 ENCOUNTER — Ambulatory Visit
Admission: RE | Admit: 2023-05-01 | Discharge: 2023-05-01 | Disposition: A | Discharge status: Home / Self Care | Payer: BC Managed Care – PPO | Source: Ambulatory Visit | Attending: Podiatry | Admitting: Podiatry

## 2023-05-01 DIAGNOSIS — M7662 Achilles tendinitis, left leg: Secondary | ICD-10-CM

## 2023-05-03 ENCOUNTER — Other Ambulatory Visit: Payer: Self-pay

## 2023-05-03 ENCOUNTER — Emergency Department: Payer: BC Managed Care – PPO

## 2023-05-03 ENCOUNTER — Emergency Department
Admission: EM | Admit: 2023-05-03 | Discharge: 2023-05-03 | Disposition: A | Discharge status: Home / Self Care | Payer: BC Managed Care – PPO | Attending: Emergency Medicine | Admitting: Emergency Medicine

## 2023-05-03 DIAGNOSIS — R079 Chest pain, unspecified: Secondary | ICD-10-CM | POA: Diagnosis not present

## 2023-05-03 DIAGNOSIS — K209 Esophagitis, unspecified without bleeding: Secondary | ICD-10-CM | POA: Insufficient documentation

## 2023-05-03 DIAGNOSIS — R0789 Other chest pain: Secondary | ICD-10-CM | POA: Diagnosis present

## 2023-05-03 LAB — CBC
HCT: 48.3 % (ref 39.0–52.0)
Hemoglobin: 15.6 g/dL (ref 13.0–17.0)
MCH: 29.4 pg (ref 26.0–34.0)
MCHC: 32.3 g/dL (ref 30.0–36.0)
MCV: 91 fL (ref 80.0–100.0)
Platelets: 384 10*3/uL (ref 150–400)
RBC: 5.31 MIL/uL (ref 4.22–5.81)
RDW: 13 % (ref 11.5–15.5)
WBC: 8.1 10*3/uL (ref 4.0–10.5)
nRBC: 0 % (ref 0.0–0.2)

## 2023-05-03 LAB — BASIC METABOLIC PANEL
Anion gap: 8 (ref 5–15)
BUN: 9 mg/dL (ref 6–20)
CO2: 26 mmol/L (ref 22–32)
Calcium: 9 mg/dL (ref 8.9–10.3)
Chloride: 104 mmol/L (ref 98–111)
Creatinine, Ser: 0.84 mg/dL (ref 0.61–1.24)
GFR, Estimated: >60 mL/min (ref >60)
Glucose, Bld: 173 mg/dL — ABNORMAL HIGH (ref 70–99)
Potassium: 3.7 mmol/L (ref 3.5–5.1)
Sodium: 138 mmol/L (ref 135–145)

## 2023-05-03 LAB — TROPONIN I (HIGH SENSITIVITY): Troponin I (High Sensitivity): 3 ng/L (ref <18)

## 2023-05-03 MED ORDER — LIDOCAINE VISCOUS HCL 2 % MT SOLN
15.0000 mL | Freq: Once | OROMUCOSAL | Status: AC
  Administered 2023-05-03: 15 mL via ORAL
  Filled 2023-05-03: qty 15

## 2023-05-03 MED ORDER — ALUM & MAG HYDROXIDE-SIMETH 200-200-20 MG/5ML PO SUSP
30.0000 mL | Freq: Once | ORAL | Status: AC
  Administered 2023-05-03: 30 mL via ORAL
  Filled 2023-05-03: qty 30

## 2023-05-03 MED ORDER — SUCRALFATE 1 G PO TABS: 1.0000 g | ORAL_TABLET | Freq: Three times a day (TID) | ORAL | 0 refills | Status: DC

## 2023-05-03 NOTE — ED Triage Notes (Signed)
Pt to ED for chest pain radiating to left shoulder started this am with shob and intermittent nausea.

## 2023-05-03 NOTE — ED Provider Notes (Signed)
[facility] Provider Note    Event Date/Time   First MD Initiated Contact with Patient 05/03/23 0957     (approximate)   History   Chest Pain   HPI  Stanley Leach is a 52 y.o. male with a history of GERD who presents with complaints of chest discomfort.  Patient reports that his and been ongoing since about 5 this morning.  Patient reports that pain is burning in nature and somewhat vertical.  Denies shortness of breath, no pleurisy, no recent travel, no lower extremity swelling or pain.     Physical Exam   Triage Vital Signs: ED Triage Vitals  Encounter Vitals Group     BP 05/03/23 0923 137/87     Systolic BP Percentile --      Diastolic BP Percentile --      Pulse Rate 05/03/23 0923 81     Resp 05/03/23 0923 20     Temp 05/03/23 0923 99 F (37.2 C)     Temp src --      SpO2 05/03/23 0923 95 %     Weight 05/03/23 0922 106.6 kg (235 lb)     Height 05/03/23 0922 1.854 m (6\' 1" )     Head Circumference --      Peak Flow --      Pain Score 05/03/23 0922 4     Pain Loc --      Pain Education --      Exclude from Growth Chart --     Most recent vital signs: Vitals:   05/03/23 0923  BP: 137/87  Pulse: 81  Resp: 20  Temp: 99 F (37.2 C)  SpO2: 95%     General: Awake, no distress.  CV:  Good peripheral perfusion.  No chest wall tenderness palpation Resp:  Normal effort.  Abd:  No distention.  Other:  No lower extremity pain or edema   ED Results / Procedures / Treatments   Labs (all labs ordered are listed, but only abnormal results are displayed) Labs Reviewed  BASIC METABOLIC PANEL - Abnormal; Notable for the following components:      Result Value   Glucose, Bld 173 (*)    All other components within normal limits  CBC  TROPONIN I (HIGH SENSITIVITY)     EKG  ED ECG REPORT I, Jene Every, the attending physician, personally viewed and interpreted this ECG.  Date: 05/03/2023  Rhythm: normal sinus  rhythm QRS Axis: normal Intervals: normal ST/T Wave abnormalities: normal Narrative Interpretation: no evidence of acute ischemia    RADIOLOGY Chest x-ray viewed interpret by me, no acute abnormality    PROCEDURES:  Critical Care performed:   Procedures   MEDICATIONS ORDERED IN ED: Medications  alum & mag hydroxide-simeth (MAALOX/MYLANTA) 200-200-20 MG/5ML suspension 30 mL (30 mLs Oral Given 05/03/23 1021)    And  lidocaine (XYLOCAINE) 2 % viscous mouth solution 15 mL (15 mLs Oral Given 05/03/23 1021)     IMPRESSION / MDM / ASSESSMENT AND PLAN / ED COURSE  I reviewed the triage vital signs and the nursing notes. Patient's presentation is most consistent with acute presentation with potential threat to life or bodily function.  Patient presents with chest pain as detailed above.  Differential includes ACS, GERD, gastritis, less likely pneumonia  EKG is reassuring, high sensitive troponin is reassuring.  Chest x-ray is benign.  Labs are unremarkable  Suspect GERD, GI cocktail trialed with some improvement.  Will start the patient on Carafate,  outpatient follow-up with cardiology, return precautions discussed, patient agrees with this plan, no indication for admission at this time given reassuring workup.        FINAL CLINICAL IMPRESSION(S) / ED DIAGNOSES   Final diagnoses:  Esophagitis     Rx / DC Orders   ED Discharge Orders          Ordered    Ambulatory referral to Gastroenterology        05/03/23 1057    sucralfate (CARAFATE) 1 g tablet  3 times daily with meals & bedtime        05/03/23 1057             Note:  This document was prepared using Dragon voice recognition software and may include unintentional dictation errors.   Jene Every, MD 05/03/23 [phone]

## 2023-05-12 ENCOUNTER — Ambulatory Visit (INDEPENDENT_AMBULATORY_CARE_PROVIDER_SITE_OTHER): Payer: BC Managed Care – PPO | Admitting: Gastroenterology

## 2023-05-12 ENCOUNTER — Encounter: Payer: Self-pay | Admitting: Gastroenterology

## 2023-05-12 VITALS — BP 137/89 | HR 78 | Temp 98.4°F | Ht 74.0 in | Wt 285.0 lb

## 2023-05-12 DIAGNOSIS — K219 Gastro-esophageal reflux disease without esophagitis: Secondary | ICD-10-CM

## 2023-05-12 DIAGNOSIS — Z1211 Encounter for screening for malignant neoplasm of colon: Secondary | ICD-10-CM

## 2023-05-12 MED ORDER — NA SULFATE-K SULFATE-MG SULF 17.5-3.13-1.6 GM/177ML PO SOLN: 1.0000 | Freq: Once | ORAL | 0 refills | Status: AC

## 2023-05-12 NOTE — Progress Notes (Signed)
Gastroenterology Consultation  Referring Provider:     Jene Every, MD Primary Care Physician:  Pcp, No Primary Gastroenterologist:  Dr. Servando Snare     Reason for Consultation:     Chest pain        HPI:   Stanley Leach is a 52 y.o. y/o male referred for consultation & management of chest pain by Dr. Oneita Hurt, No.  This patient comes in after being seen in the emergency department with a report of chest pain that had started 5 hours before he was in the emergency department.  The patient had stated that it was a burning sensation and described as a vertical pain.  The patient was evaluated by the ED and was sent to GI for evaluation for possible esophagitis or reflux as a cause of his symptoms. The patient reports that his symptoms have completely resolved.  He only had 1 episode and now he is feeling much better.  The patient states that the pain woke him up from sleep and he thought he was having a heart attack.  He had not taken any different medications before he went to sleep and reports that he takes his Protonix before he goes to sleep.  No past medical history on file.  No past surgical history on file.  Prior to Admission medications   Medication Sig Start Date End Date Taking? Authorizing Provider  sucralfate (CARAFATE) 1 g tablet Take 1 tablet (1 g total) by mouth 4 (four) times daily -  with meals and at bedtime for 15 days. 05/03/23 05/18/23  Jene Every, MD    No family history on file.   Social History   Tobacco Use   Smoking status: Heavy Smoker    Current packs/day: 0.50    Types: Cigarettes   Smokeless tobacco: Never  Substance Use Topics   Alcohol use: Yes   Drug use: No    Allergies as of 05/12/2023   (No Known Allergies)    Review of Systems:    All systems reviewed and negative except where noted in HPI.   Physical Exam:  BP 137/89 (BP Location: Left Arm, Patient Position: Sitting, Cuff Size: Large)   Pulse 78   Temp 98.4 F (36.9 C) (Oral)    Ht 6\' 2"  (1.88 m)   Wt 285 lb (129.3 kg)   BMI 36.59 kg/m  No LMP for male patient. General:   Alert,  Well-developed, well-nourished, pleasant and cooperative in NAD Head:  Normocephalic and atraumatic. Eyes:  Sclera clear, no icterus.   Conjunctiva pink. Ears:  Normal auditory acuity. Neck:  Supple; no masses or thyromegaly. Lungs:  Respirations even and unlabored.  Clear throughout to auscultation.   No wheezes, crackles, or rhonchi. No acute distress. Heart:  Regular rate and rhythm; no murmurs, clicks, rubs, or gallops. Abdomen:  Normal bowel sounds.  No bruits.  Soft, non-tender and non-distended without masses, hepatosplenomegaly or hernias noted.  No guarding or rebound tenderness.  Negative Carnett sign.   Rectal:  Deferred.  Pulses:  Normal pulses noted. Extremities:  No clubbing or edema.  No cyanosis. Neurologic:  Alert and oriented x3;  grossly normal neurologically. Skin:  Intact without significant lesions or rashes.  No jaundice. Lymph Nodes:  No significant cervical adenopathy. Psych:  Alert and cooperative. Normal mood and affect.  Imaging Studies: DG Chest 2 View  Result Date: 05/03/2023 CLINICAL DATA:  Chest pain EXAM: CHEST - 2 VIEW COMPARISON:  02/03/2021 FINDINGS: Low lung volumes accentuating the  basilar vascular markings. Minor basilar atelectasis. No definite pneumonia, significant collapse or consolidation. No edema, large effusion or pneumothorax. Trachea midline. Degenerative changes of the spine noted. IMPRESSION: Low volume exam with basilar atelectasis. Electronically Signed   By: Judie Petit.  Shick M.D.   On: 05/03/2023 10:18    Assessment and Plan:   Stanley Leach is a 52 y.o. y/o male who comes in today with a report of chest pain that woke him up at 5 AM.  The patient thought he was having a heart attack and went to the emergency department.  The patient was thought to have esophagitis as a possible cause and was sent to me.  The patient could have  had a esophageal spasm versus possible pill esophagitis.  The patient has been explained this and has been told that he needs a screening colonoscopy.  The patient also has had longstanding GERD for which he takes Protonix and therefore will be set up for an EGD at the same time.  The patient has been explained the plan and agrees with it.    Midge Minium, MD. Clementeen Graham    Note: This dictation was prepared with Dragon dictation along with smaller phrase technology. Any transcriptional errors that result from this process are unintentional.

## 2023-05-17 ENCOUNTER — Encounter: Payer: Self-pay | Admitting: Gastroenterology

## 2023-06-22 ENCOUNTER — Ambulatory Visit: Payer: BC Managed Care – PPO | Admitting: Physician Assistant

## 2023-06-29 ENCOUNTER — Encounter: Admission: RE | Payer: Self-pay | Source: Home / Self Care

## 2023-06-29 ENCOUNTER — Ambulatory Visit
Admission: RE | Admit: 2023-06-29 | Payer: BC Managed Care – PPO | Source: Home / Self Care | Admitting: Gastroenterology

## 2023-06-29 SURGERY — COLONOSCOPY WITH PROPOFOL
Anesthesia: General

## 2023-07-08 ENCOUNTER — Other Ambulatory Visit: Payer: Self-pay | Admitting: Podiatry

## 2023-07-22 ENCOUNTER — Encounter
Admission: RE | Admit: 2023-07-22 | Discharge: 2023-07-22 | Disposition: A | Discharge status: Home / Self Care | Payer: BC Managed Care – PPO | Source: Ambulatory Visit | Attending: Podiatry | Admitting: Podiatry

## 2023-07-22 HISTORY — DX: Gastrointestinal hemorrhage, unspecified: K92.2

## 2023-07-22 HISTORY — DX: Tobacco use: Z72.0

## 2023-07-22 HISTORY — DX: Gastro-esophageal reflux disease without esophagitis: K21.9

## 2023-07-22 HISTORY — DX: Diverticulosis of intestine, part unspecified, without perforation or abscess without bleeding: K57.90

## 2023-07-22 HISTORY — DX: Achilles tendinitis, unspecified leg: M76.60

## 2023-07-22 HISTORY — DX: Hyperlipidemia, unspecified: E78.5

## 2023-07-22 NOTE — Patient Instructions (Addendum)
Your procedure is scheduled on:07-30-23 Friday Report to the Registration Desk on the 1st floor of the [facility].Then proceed to the 2nd floor Surgery Desk To find out your arrival time, please call [phone] between 1PM - 3PM on:07-29-23 Thursday If your arrival time is 6:00 am, do not arrive before that time as the [facility] entrance doors do not open until 6:00 am.  REMEMBER: Instructions that are not followed completely may result in serious medical risk, up to and including death; or upon the discretion of your surgeon and anesthesiologist your surgery may need to be rescheduled.  Do not eat food after midnight the night before surgery.  No gum chewing or hard candies.  You may however, drink CLEAR liquids up to 2 hours before you are scheduled to arrive for your surgery. Do not drink anything within 2 hours of your scheduled arrival time.  Clear liquids include: - water  - apple juice without pulp - gatorade (not RED colors) - black coffee or tea (Do NOT add milk or creamers to the coffee or tea) Do NOT drink anything that is not on this list  In addition, your doctor has ordered for you to drink the provided:  Ensure Pre-Surgery Clear Carbohydrate Drink Drinking this carbohydrate drink up to two hours before surgery helps to reduce insulin resistance and improve patient outcomes. Please complete drinking 2 hours before scheduled arrival time.  One week prior to surgery:Stop NOW (07-22-23) Stop Anti-inflammatories (NSAIDS) such as Advil, Aleve, Ibuprofen, Motrin, Naproxen, Naprosyn and Aspirin based products such as Excedrin, Goody's Powder, BC Powder. Stop ANY OVER THE COUNTER supplements until after surgery (Collagen Powder)  You may however, continue to take Tylenol if needed for pain up until the day of surgery.  Continue taking all of your other prescription medications up until the day of surgery.  Do NOT take any medication the day of surgery  No Alcohol for 24  hours before or after surgery.  No Smoking including e-cigarettes for 24 hours before surgery.  No chewable tobacco products for at least 6 hours before surgery.  No nicotine patches on the day of surgery.  Do not use any "recreational" drugs for at least a week (preferably 2 weeks) before your surgery.  Please be advised that the combination of cocaine and anesthesia may have negative outcomes, up to and including death. If you test positive for cocaine, your surgery will be cancelled.  On the morning of surgery brush your teeth with toothpaste and water, you may rinse your mouth with mouthwash if you wish. Do not swallow any toothpaste or mouthwash.  Use CHG Soap as directed on instruction sheet.  Do not wear jewelry, make-up, hairpins, clips or nail polish.  For welded (permanent) jewelry: bracelets, anklets, waist bands, etc.  Please have this removed prior to surgery.  If it is not removed, there is a chance that hospital personnel will need to cut it off on the day of surgery.  Do not wear lotions, powders, or perfumes.   Do not shave body hair from the neck down 48 hours before surgery.  Contact lenses, hearing aids and dentures may not be worn into surgery.  Do not bring valuables to the hospital. [facility] is not responsible for any missing/lost belongings or valuables.   Notify your doctor if there is any change in your medical condition (cold, fever, infection).  Wear comfortable clothing (specific to your surgery type) to the hospital.  After surgery, you can help prevent  lung complications by doing breathing exercises.  Take deep breaths and cough every 1-2 hours. Your doctor may order a device called an Incentive Spirometer to help you take deep breaths. When coughing or sneezing, hold a pillow firmly against your incision with both hands. This is called "splinting." Doing this helps protect your incision. It also decreases belly discomfort.  If you are being  admitted to the hospital overnight, leave your suitcase in the car. After surgery it may be brought to your room.  In case of increased patient census, it may be necessary for you, the patient, to continue your postoperative care in the Same Day Surgery department.  If you are being discharged the day of surgery, you will not be allowed to drive home. You will need a responsible individual to drive you home and stay with you for 24 hours after surgery.   If you are taking public transportation, you will need to have a responsible individual with you.  Please call the Pre-admissions Testing Dept. at [phone] if you have any questions about these instructions.  Surgery Visitation Policy:  Patients having surgery or a procedure may have two visitors.  Children under the age of 51 must have an adult with them who is not the patient.     Preparing for Surgery with CHLORHEXIDINE GLUCONATE (CHG) Soap  Chlorhexidine Gluconate (CHG) Soap  o An antiseptic cleaner that kills germs and bonds with the skin to continue killing germs even after washing  o Used for showering the night before surgery and morning of surgery  Before surgery, you can play an important role by reducing the number of germs on your skin.  CHG (Chlorhexidine gluconate) soap is an antiseptic cleanser which kills germs and bonds with the skin to continue killing germs even after washing.  Please do not use if you have an allergy to CHG or antibacterial soaps. If your skin becomes reddened/irritated stop using the CHG.  1. Shower the NIGHT BEFORE SURGERY and the MORNING OF SURGERY with CHG soap.  2. If you choose to wash your hair, wash your hair first as usual with your normal shampoo.  3. After shampooing, rinse your hair and body thoroughly to remove the shampoo.  4. Use CHG as you would any other liquid soap. You can apply CHG directly to the skin and wash gently with a scrungie or a clean washcloth.  5. Apply  the CHG soap to your body only from the neck down. Do not use on open wounds or open sores. Avoid contact with your eyes, ears, mouth, and genitals (private parts). Wash face and genitals (private parts) with your normal soap.  6. Wash thoroughly, paying special attention to the area where your surgery will be performed.  7. Thoroughly rinse your body with warm water.  8. Do not shower/wash with your normal soap after using and rinsing off the CHG soap.  9. Pat yourself dry with a clean towel.  10. Wear clean pajamas to bed the night before surgery.  12. Place clean sheets on your bed the night of your first shower and do not sleep with pets.  13. Shower again with the CHG soap on the day of surgery prior to arriving at the hospital.  14. Do not apply any deodorants/lotions/powders.  15. Please wear clean clothes to the hospital.  How to Use an Incentive Spirometer An incentive spirometer is a tool that measures how well you are filling your lungs with each breath. Learning to  take long, deep breaths using this tool can help you keep your lungs clear and active. This may help to reverse or lessen your chance of developing breathing (pulmonary) problems, especially infection. You may be asked to use a spirometer: After a surgery. If you have a lung problem or a history of smoking. After a long period of time when you have been unable to move or be active. If the spirometer includes an indicator to show the highest number that you have reached, your health care provider or respiratory therapist will help you set a goal. Keep a log of your progress as told by your health care provider. What are the risks? Breathing too quickly may cause dizziness or cause you to pass out. Take your time so you do not get dizzy or light-headed. If you are in pain, you may need to take pain medicine before doing incentive spirometry. It is harder to take a deep breath if you are having pain. How to use your  incentive spirometer  Sit up on the edge of your bed or on a chair. Hold the incentive spirometer so that it is in an upright position. Before you use the spirometer, breathe out normally. Place the mouthpiece in your mouth. Make sure your lips are closed tightly around it. Breathe in slowly and as deeply as you can through your mouth, causing the piston or the ball to rise toward the top of the chamber. Hold your breath for 3-5 seconds, or for as long as possible. If the spirometer includes a coach indicator, use this to guide you in breathing. Slow down your breathing if the indicator goes above the marked areas. Remove the mouthpiece from your mouth and breathe out normally. The piston or ball will return to the bottom of the chamber. Rest for a few seconds, then repeat the steps 10 or more times. Take your time and take a few normal breaths between deep breaths so that you do not get dizzy or light-headed. Do this every 1-2 hours when you are awake. If the spirometer includes a goal marker to show the highest number you have reached (best effort), use this as a goal to work toward during each repetition. After each set of 10 deep breaths, cough a few times. This will help to make sure that your lungs are clear. If you have an incision on your chest or abdomen from surgery, place a pillow or a rolled-up towel firmly against the incision when you cough. This can help to reduce pain while taking deep breaths and coughing. General tips When you are able to get out of bed: Walk around often. Continue to take deep breaths and cough in order to clear your lungs. Keep using the incentive spirometer until your health care provider says it is okay to stop using it. If you have been in the hospital, you may be told to keep using the spirometer at home. Contact a health care provider if: You are having difficulty using the spirometer. You have trouble using the spirometer as often as instructed. Your  pain medicine is not giving enough relief for you to use the spirometer as told. You have a fever. Get help right away if: You develop shortness of breath. You develop a cough with bloody mucus from the lungs. You have fluid or blood coming from an incision site after you cough. Summary An incentive spirometer is a tool that can help you learn to take long, deep breaths to keep your lungs  clear and active. You may be asked to use a spirometer after a surgery, if you have a lung problem or a history of smoking, or if you have been inactive for a long period of time. Use your incentive spirometer as instructed every 1-2 hours while you are awake. If you have an incision on your chest or abdomen, place a pillow or a rolled-up towel firmly against your incision when you cough. This will help to reduce pain. Get help right away if you have shortness of breath, you cough up bloody mucus, or blood comes from your incision when you cough. This information is not intended to replace advice given to you by your health care provider. Make sure you discuss any questions you have with your health care provider. Document Revised: 11/27/2019 Document Reviewed: 11/27/2019 Elsevier Patient Education  2024 [employer].

## 2023-07-29 MED ORDER — LACTATED RINGERS IV SOLN: INTRAVENOUS | Status: DC

## 2023-07-29 MED ORDER — ORAL CARE MOUTH RINSE: 15.0000 mL | Freq: Once | OROMUCOSAL | Status: AC

## 2023-07-29 MED ORDER — CHLORHEXIDINE GLUCONATE 0.12 % MT SOLN
15.0000 mL | Freq: Once | OROMUCOSAL | Status: AC
  Administered 2023-07-30: 15 mL via OROMUCOSAL

## 2023-07-29 MED ORDER — CEFAZOLIN IN SODIUM CHLORIDE 3-0.9 GM/100ML-% IV SOLN
3.0000 g | INTRAVENOUS | Status: AC
  Administered 2023-07-30: 3 g via INTRAVENOUS
  Filled 2023-07-29: qty 100

## 2023-07-30 ENCOUNTER — Ambulatory Visit: Payer: BC Managed Care – PPO | Admitting: Anesthesiology

## 2023-07-30 ENCOUNTER — Other Ambulatory Visit: Payer: Self-pay

## 2023-07-30 ENCOUNTER — Ambulatory Visit: Payer: BC Managed Care – PPO

## 2023-07-30 ENCOUNTER — Encounter: Payer: Self-pay | Admitting: Podiatry

## 2023-07-30 ENCOUNTER — Encounter
Admission: RE | Disposition: A | Discharge status: Home / Self Care | Payer: Self-pay | Source: Home / Self Care | Attending: Podiatry

## 2023-07-30 ENCOUNTER — Ambulatory Visit
Admission: RE | Admit: 2023-07-30 | Discharge: 2023-07-30 | Disposition: A | Discharge status: Home / Self Care | Payer: BC Managed Care – PPO | Attending: Podiatry | Admitting: Podiatry

## 2023-07-30 DIAGNOSIS — K219 Gastro-esophageal reflux disease without esophagitis: Secondary | ICD-10-CM | POA: Insufficient documentation

## 2023-07-30 DIAGNOSIS — M899 Disorder of bone, unspecified: Secondary | ICD-10-CM | POA: Insufficient documentation

## 2023-07-30 DIAGNOSIS — M7661 Achilles tendinitis, right leg: Secondary | ICD-10-CM | POA: Diagnosis present

## 2023-07-30 DIAGNOSIS — F17210 Nicotine dependence, cigarettes, uncomplicated: Secondary | ICD-10-CM | POA: Diagnosis not present

## 2023-07-30 HISTORY — PX: BONE EXOSTOSIS EXCISION: SHX1249

## 2023-07-30 HISTORY — PX: ACHILLES TENDON SURGERY: SHX542

## 2023-07-30 SURGERY — REPAIR, TENDON, ACHILLES
Anesthesia: General | Site: Foot | Laterality: Right

## 2023-07-30 MED ORDER — FENTANYL CITRATE (PF) 100 MCG/2ML IJ SOLN
INTRAMUSCULAR | Status: AC
  Filled 2023-07-30: qty 2

## 2023-07-30 MED ORDER — BUPIVACAINE LIPOSOME 1.3 % IJ SUSP
INTRAMUSCULAR | Status: AC
  Filled 2023-07-30: qty 10

## 2023-07-30 MED ORDER — BUPIVACAINE-EPINEPHRINE (PF) 0.25% -1:200000 IJ SOLN
INTRAMUSCULAR | Status: AC
  Filled 2023-07-30: qty 30

## 2023-07-30 MED ORDER — MIDAZOLAM HCL 2 MG/2ML IJ SOLN
INTRAMUSCULAR | Status: AC
  Filled 2023-07-30: qty 2

## 2023-07-30 MED ORDER — OXYCODONE HCL 5 MG PO TABS: 5.0000 mg | ORAL_TABLET | Freq: Once | ORAL | Status: DC | PRN

## 2023-07-30 MED ORDER — CHLORHEXIDINE GLUCONATE 0.12 % MT SOLN
OROMUCOSAL | Status: AC
  Filled 2023-07-30: qty 15

## 2023-07-30 MED ORDER — SUGAMMADEX SODIUM 200 MG/2ML IV SOLN
INTRAVENOUS | Status: DC | PRN
  Administered 2023-07-30: 200 mg via INTRAVENOUS

## 2023-07-30 MED ORDER — FENTANYL CITRATE (PF) 100 MCG/2ML IJ SOLN
INTRAMUSCULAR | Status: DC | PRN
  Administered 2023-07-30 (×2): 50 ug via INTRAVENOUS

## 2023-07-30 MED ORDER — PROPOFOL 10 MG/ML IV BOLUS
INTRAVENOUS | Status: AC
  Filled 2023-07-30: qty 20

## 2023-07-30 MED ORDER — METOCLOPRAMIDE HCL 10 MG PO TABS: 5.0000 mg | ORAL_TABLET | Freq: Three times a day (TID) | ORAL | Status: DC | PRN

## 2023-07-30 MED ORDER — METOCLOPRAMIDE HCL 5 MG/ML IJ SOLN: 5.0000 mg | Freq: Three times a day (TID) | INTRAMUSCULAR | Status: DC | PRN

## 2023-07-30 MED ORDER — 0.9 % SODIUM CHLORIDE (POUR BTL) OPTIME
TOPICAL | Status: DC | PRN
  Administered 2023-07-30: 500 mL

## 2023-07-30 MED ORDER — ONDANSETRON HCL 4 MG/2ML IJ SOLN
INTRAMUSCULAR | Status: AC
  Filled 2023-07-30: qty 2

## 2023-07-30 MED ORDER — ONDANSETRON HCL 4 MG/2ML IJ SOLN
INTRAMUSCULAR | Status: DC | PRN
  Administered 2023-07-30: 4 mg via INTRAVENOUS

## 2023-07-30 MED ORDER — DEXAMETHASONE SODIUM PHOSPHATE 10 MG/ML IJ SOLN
INTRAMUSCULAR | Status: DC | PRN
  Administered 2023-07-30: 5 mg via INTRAVENOUS

## 2023-07-30 MED ORDER — OXYCODONE HCL 5 MG/5ML PO SOLN: 5.0000 mg | Freq: Once | ORAL | Status: DC | PRN

## 2023-07-30 MED ORDER — LACTATED RINGERS IV SOLN: INTRAVENOUS | Status: DC | PRN

## 2023-07-30 MED ORDER — DEXAMETHASONE SODIUM PHOSPHATE 10 MG/ML IJ SOLN
INTRAMUSCULAR | Status: AC
  Filled 2023-07-30: qty 1

## 2023-07-30 MED ORDER — BUPIVACAINE-EPINEPHRINE (PF) 0.25% -1:200000 IJ SOLN
INTRAMUSCULAR | Status: DC | PRN
  Administered 2023-07-30: 20 mL

## 2023-07-30 MED ORDER — ONDANSETRON HCL 4 MG/2ML IJ SOLN: 4.0000 mg | Freq: Four times a day (QID) | INTRAMUSCULAR | Status: DC | PRN

## 2023-07-30 MED ORDER — ONDANSETRON HCL 4 MG PO TABS: 4.0000 mg | ORAL_TABLET | Freq: Four times a day (QID) | ORAL | Status: DC | PRN

## 2023-07-30 MED ORDER — ROCURONIUM BROMIDE 10 MG/ML (PF) SYRINGE
PREFILLED_SYRINGE | INTRAVENOUS | Status: AC
Start: 2023-07-30 — End: ?
  Filled 2023-07-30: qty 10

## 2023-07-30 MED ORDER — FENTANYL CITRATE (PF) 100 MCG/2ML IJ SOLN: 25.0000 ug | INTRAMUSCULAR | Status: DC | PRN

## 2023-07-30 MED ORDER — SUCCINYLCHOLINE CHLORIDE 200 MG/10ML IV SOSY
PREFILLED_SYRINGE | INTRAVENOUS | Status: DC | PRN
  Administered 2023-07-30: 160 mg via INTRAVENOUS

## 2023-07-30 MED ORDER — ROCURONIUM BROMIDE 100 MG/10ML IV SOLN
INTRAVENOUS | Status: DC | PRN
  Administered 2023-07-30: 30 mg via INTRAVENOUS
  Administered 2023-07-30: 50 mg via INTRAVENOUS

## 2023-07-30 MED ORDER — PROPOFOL 10 MG/ML IV BOLUS
INTRAVENOUS | Status: DC | PRN
  Administered 2023-07-30: 200 mg via INTRAVENOUS

## 2023-07-30 MED ORDER — MIDAZOLAM HCL 2 MG/2ML IJ SOLN
INTRAMUSCULAR | Status: DC | PRN
  Administered 2023-07-30: 2 mg via INTRAVENOUS

## 2023-07-30 MED ORDER — LIDOCAINE HCL (CARDIAC) PF 100 MG/5ML IV SOSY
PREFILLED_SYRINGE | INTRAVENOUS | Status: DC | PRN
  Administered 2023-07-30: 60 mg via INTRAVENOUS

## 2023-07-30 MED ORDER — OXYCODONE-ACETAMINOPHEN 5-325 MG PO TABS
1.0000 | ORAL_TABLET | Freq: Four times a day (QID) | ORAL | 0 refills | Status: AC | PRN
Start: 2023-07-30 — End: ?

## 2023-07-30 MED ORDER — LIDOCAINE HCL (PF) 2 % IJ SOLN
INTRAMUSCULAR | Status: AC
  Filled 2023-07-30: qty 5

## 2023-07-30 MED ORDER — SEVOFLURANE IN SOLN
RESPIRATORY_TRACT | Status: AC
  Filled 2023-07-30: qty 250

## 2023-07-30 MED ORDER — ACETAMINOPHEN 10 MG/ML IV SOLN: 1000.0000 mg | Freq: Once | INTRAVENOUS | Status: DC | PRN

## 2023-07-30 MED ORDER — ONDANSETRON HCL 4 MG/2ML IJ SOLN: 4.0000 mg | Freq: Once | INTRAMUSCULAR | Status: DC | PRN

## 2023-07-30 SURGICAL SUPPLY — 74 items
ANCH SUT 4.5 FTPRNT PEEK-OPTM (Anchor) IMPLANT
ANCH SUT 4.5 SLIDING SELF LOCK (Anchor) ×1 IMPLANT
ANCHOR SUT 4.5 SLIDING SLF LCK (Anchor) IMPLANT
BIT DRILL 4X4.5 FOOTPRINT STR (BIT) ×1 IMPLANT
BLADE SURG 15 STRL LF DISP TIS (BLADE) ×1 IMPLANT
BLADE SURG 15 STRL SS (BLADE) ×1
BLADE SURG MINI STRL (BLADE) ×1 IMPLANT
BNDG CMPR 75X21 PLY HI ABS (MISCELLANEOUS) ×1
BNDG CMPR STD VLCR NS LF 5.8X4 (GAUZE/BANDAGES/DRESSINGS) ×2
BNDG ELASTIC 4X5.8 VLCR NS LF (GAUZE/BANDAGES/DRESSINGS) ×2 IMPLANT
BNDG ESMARCH 4 X 12 STRL LF (GAUZE/BANDAGES/DRESSINGS) ×1
BNDG ESMARCH 4X12 STRL LF (GAUZE/BANDAGES/DRESSINGS) ×1 IMPLANT
BNDG ESMARCH 6 X 12 STRL LF (GAUZE/BANDAGES/DRESSINGS) ×1
BNDG ESMARCH 6X12 STRL LF (GAUZE/BANDAGES/DRESSINGS) ×1 IMPLANT
BNDG GAUZE DERMACEA FLUFF 4 (GAUZE/BANDAGES/DRESSINGS) ×1 IMPLANT
BNDG GZE 12X3 1 PLY HI ABS (GAUZE/BANDAGES/DRESSINGS) ×1
BNDG GZE DERMACEA 4 6PLY (GAUZE/BANDAGES/DRESSINGS) ×1
BNDG STRETCH GAUZE 3IN X12FT (GAUZE/BANDAGES/DRESSINGS) ×1 IMPLANT
BOOT STEPPER DURA LG (SOFTGOODS) IMPLANT
CUFF TOURN SGL QUICK 30 (TOURNIQUET CUFF) ×1
CUFF TRNQT CYL 30X4X21-28X (TOURNIQUET CUFF) IMPLANT
DRAPE FLUOR MINI C-ARM 54X84 (DRAPES) ×1 IMPLANT
DRILL 4X4.5 FOOTPRINT STR (BIT) ×1
DURAPREP 26ML APPLICATOR (WOUND CARE) ×1 IMPLANT
ELECT REM PT RETURN 9FT ADLT (ELECTROSURGICAL) ×1
ELECTRODE REM PT RTRN 9FT ADLT (ELECTROSURGICAL) ×1 IMPLANT
GAUZE SPONGE 4X4 12PLY STRL (GAUZE/BANDAGES/DRESSINGS) ×1 IMPLANT
GAUZE STRETCH 2X75IN STRL (MISCELLANEOUS) ×1 IMPLANT
GAUZE XEROFORM 1X8 LF (GAUZE/BANDAGES/DRESSINGS) ×1 IMPLANT
GLOVE BIO SURGEON STRL SZ7.5 (GLOVE) ×1 IMPLANT
GLOVE INDICATOR 8.0 STRL GRN (GLOVE) ×1 IMPLANT
GOWN STRL REUS W/ TWL XL LVL3 (GOWN DISPOSABLE) ×2 IMPLANT
GOWN STRL REUS W/TWL XL LVL3 (GOWN DISPOSABLE) ×2
HANDLE YANKAUER SUCT BULB TIP (MISCELLANEOUS) ×1 IMPLANT
IV NS 500ML (IV SOLUTION) ×1
IV NS 500ML BAXH (IV SOLUTION) ×1 IMPLANT
KIT TURNOVER KIT A (KITS) ×1 IMPLANT
LABEL OR SOLS (LABEL) IMPLANT
MANIFOLD NEPTUNE II (INSTRUMENTS) ×1 IMPLANT
NDL FILTER BLUNT 18X1 1/2 (NEEDLE) ×1 IMPLANT
NDL HYPO 18GX1.5 BLUNT FILL (NEEDLE) ×1 IMPLANT
NDL HYPO 25X1 1.5 SAFETY (NEEDLE) ×3 IMPLANT
NDL MAYO CATGUT SZ5 (NEEDLE)
NDL SUT 5 .5 CRC TPR PNT MAYO (NEEDLE) ×1 IMPLANT
NEEDLE FILTER BLUNT 18X1 1/2 (NEEDLE) ×1 IMPLANT
NEEDLE HYPO 18GX1.5 BLUNT FILL (NEEDLE) ×1 IMPLANT
NEEDLE HYPO 25X1 1.5 SAFETY (NEEDLE) ×3 IMPLANT
NS IRRIG 500ML POUR BTL (IV SOLUTION) ×1 IMPLANT
PACK EXTREMITY ARMC (MISCELLANEOUS) ×1 IMPLANT
RASP SM TEAR CROSS CUT (RASP) IMPLANT
SPLINT CAST 1 STEP 5X30 WHT (MISCELLANEOUS) ×1 IMPLANT
SPLINT PLASTER CAST FAST 5X30 (CAST SUPPLIES) ×1 IMPLANT
SPONGE T-LAP 18X18 ~~LOC~~+RFID (SPONGE) ×1 IMPLANT
STOCKINETTE IMPERVIOUS 9X36 MD (GAUZE/BANDAGES/DRESSINGS) ×1 IMPLANT
STOCKINETTE M/LG 89821 (MISCELLANEOUS) ×1 IMPLANT
STRIP CLOSURE SKIN 1/2X4 (GAUZE/BANDAGES/DRESSINGS) ×1 IMPLANT
SUT MNCRL 4-0 (SUTURE) ×1
SUT MNCRL 4-0 27XMFL (SUTURE) ×1
SUT PDS AB 0 CT1 27 (SUTURE) IMPLANT
SUT ULTRABRAID #2 38 (SUTURE) ×1 IMPLANT
SUT VIC AB 0 SH 27 (SUTURE) ×1 IMPLANT
SUT VIC AB 2-0 SH 27 (SUTURE) ×1
SUT VIC AB 2-0 SH 27XBRD (SUTURE) ×2 IMPLANT
SUT VIC AB 3-0 SH 27 (SUTURE) ×1
SUT VIC AB 3-0 SH 27X BRD (SUTURE) ×1 IMPLANT
SUT VIC AB 4-0 FS2 27 (SUTURE) ×1 IMPLANT
SUT VICRYL AB 3-0 FS1 BRD 27IN (SUTURE) ×1 IMPLANT
SUTURE MNCRL 4-0 27XMF (SUTURE) ×1 IMPLANT
SWABSTK COMLB BENZOIN TINCTURE (MISCELLANEOUS) ×1 IMPLANT
SYR 10ML LL (SYRINGE) ×2 IMPLANT
SYR 3ML LL SCALE MARK (SYRINGE) ×1 IMPLANT
TRAP FLUID SMOKE EVACUATOR (MISCELLANEOUS) ×1 IMPLANT
WAND TOPAZ MICRO DEBRIDER (MISCELLANEOUS) IMPLANT
WATER STERILE IRR 500ML POUR (IV SOLUTION) ×1 IMPLANT

## 2023-07-30 NOTE — Discharge Instructions (Signed)
[facility] SURGERY [facility]ENTER  POST OPERATIVE INSTRUCTIONS FOR DR. Ether Griffins AND DR. [facility] CLINIC PODIATRY DEPARTMENT   Take your medication as prescribed.  Pain medication should be taken only as needed.  Take an aspirin daily.  Keep the dressing clean, dry and intact.  Keep your foot elevated above the heart level for the first 48 hours.  We have instructed you to be non-weight bearing.  Always wear your post-op shoe when walking.  Always use your crutches if you are to be non-weight bearing.  Do not take a shower. Baths are permissible as long as the foot is kept out of the water.   Every hour you are awake:  Bend your knee 15 times.  Call [facility] [phone]) if any of the following problems occur: You develop a temperature or fever. The bandage becomes saturated with blood. Medication does not stop your pain. Injury of the foot occurs. Any symptoms of infection including redness, odor, or red streaks running from wound.

## 2023-07-30 NOTE — Transfer of Care (Signed)
Immediate Anesthesia Transfer of Care Note  Patient: Stanley Leach  Procedure(s) Performed: ACHILLES TENDON REPAIR - PRIMARY (Right: Foot) 16109 - ACHILLES REPAIR - SECONDARY WITH EXCISION OF EXOSTOSIS (Right: Foot)  Patient Location: PACU  Anesthesia Type:General  Level of Consciousness: drowsy  Airway & Oxygen Therapy: Patient Spontanous Breathing and Patient connected to face mask oxygen  Post-op Assessment: Report given to RN and Post -op Vital signs reviewed and stable  Post vital signs: Reviewed and stable  Last Vitals:  Vitals Value Taken Time  BP 118/81 07/30/23 0916  Temp    Pulse 91 07/30/23 0920  Resp 13 07/30/23 0920  SpO2 100 % 07/30/23 0920  Vitals shown include unfiled device data.  Last Pain:  Vitals:   07/30/23 0626  TempSrc: Temporal  PainSc: 0-No pain         Complications: No notable events documented.

## 2023-07-30 NOTE — Anesthesia Procedure Notes (Signed)
Procedure Name: Intubation Date/Time: 07/30/2023 7:41 AM  Performed by: Monico Hoar, CRNAPre-anesthesia Checklist: Patient identified, Patient being monitored, Timeout performed, Emergency Drugs available and Suction available Patient Re-evaluated:Patient Re-evaluated prior to induction Oxygen Delivery Method: Circle system utilized Preoxygenation: Pre-oxygenation with 100% oxygen Induction Type: IV induction Ventilation: Unable to mask ventilate Laryngoscope Size: McGraph and 4 Grade View: Grade I Tube type: Oral Tube size: 7.5 mm Number of attempts: 1 Airway Equipment and Method: Stylet Placement Confirmation: ETT inserted through vocal cords under direct vision, positive ETCO2 and breath sounds checked- equal and bilateral Secured at: 22 cm Tube secured with: Tape Dental Injury: Teeth and Oropharynx as per pre-operative assessment

## 2023-07-30 NOTE — Anesthesia Preprocedure Evaluation (Signed)
Anesthesia Evaluation  Patient identified by MRN, date of birth, ID band Patient awake    Reviewed: Allergy & Precautions, NPO status , Patient's Chart, lab work & pertinent test results  History of Anesthesia Complications Negative for: history of anesthetic complications  Airway Mallampati: III  TM Distance: >3 FB Neck ROM: Full    Dental no notable dental hx. (+) Teeth Intact   Pulmonary neg sleep apnea, neg COPD, Current Smoker and Patient abstained from smoking.   Pulmonary exam normal breath sounds clear to auscultation       Cardiovascular Exercise Tolerance: Good METS(-) hypertension(-) CAD and (-) Past MI negative cardio ROS (-) dysrhythmias  Rhythm:Regular Rate:Normal - Systolic murmurs    Neuro/Psych neg Headaches negative neurological ROS  negative psych ROS   GI/Hepatic ,GERD  Medicated and Poorly Controlled,,(+)     (-) substance abuse    Endo/Other  neg diabetes    Renal/GU negative Renal ROS     Musculoskeletal   Abdominal  (+) + obese  Peds  Hematology   Anesthesia Other Findings Past Medical History: No date: Distal Achilles tendinitis No date: Diverticulosis No date: GERD (gastroesophageal reflux disease) No date: GI bleed No date: Hyperlipidemia No date: Tobacco use  Reproductive/Obstetrics                             Anesthesia Physical Anesthesia Plan  ASA: 2  Anesthesia Plan: General   Post-op Pain Management: Ofirmev IV (intra-op)*   Induction: Intravenous  PONV Risk Score and Plan: 2 and Ondansetron, Dexamethasone and Midazolam  Airway Management Planned: Oral ETT and Video Laryngoscope Planned  Additional Equipment: None  Intra-op Plan:   Post-operative Plan: Extubation in OR  Informed Consent: I have reviewed the patients History and Physical, chart, labs and discussed the procedure including the risks, benefits and alternatives for the  proposed anesthesia with the patient or authorized representative who has indicated his/her understanding and acceptance.     Dental advisory given  Plan Discussed with: CRNA and Surgeon  Anesthesia Plan Comments: (Discussed risks of anesthesia with patient, including PONV, sore throat, lip/dental/eye damage. Rare risks discussed as well, such as cardiorespiratory and neurological sequelae, and allergic reactions. Discussed the role of CRNA in patient's perioperative care. Patient understands. Patient counseled on benefits of smoking cessation, and increased perioperative risks associated with continued smoking. )       Anesthesia Quick Evaluation

## 2023-07-30 NOTE — Anesthesia Postprocedure Evaluation (Signed)
Anesthesia Post Note  Patient: Stanley Leach  Procedure(s) Performed: ACHILLES TENDON REPAIR - PRIMARY (Right: Foot) [id number] - ACHILLES REPAIR - SECONDARY WITH EXCISION OF EXOSTOSIS (Right: Foot)  Patient location during evaluation: PACU Anesthesia Type: General Level of consciousness: awake and alert Pain management: pain level controlled Vital Signs Assessment: post-procedure vital signs reviewed and stable Respiratory status: spontaneous breathing, nonlabored ventilation, respiratory function stable and patient connected to nasal cannula oxygen Cardiovascular status: blood pressure returned to baseline and stable Postop Assessment: no apparent nausea or vomiting Anesthetic complications: no   No notable events documented.   Last Vitals:  Vitals:   07/30/23 0945 07/30/23 1003  BP: 114/85 (!) 125/92  Pulse: 82 75  Resp: 11 12  Temp: (!) 36.3 C 36.5 C  SpO2: 98% 100%    Last Pain:  Vitals:   07/30/23 1003  TempSrc: Temporal  PainSc: 0-No pain                 Corinda Gubler

## 2023-07-30 NOTE — H&P (Signed)
HISTORY AND PHYSICAL INTERVAL NOTE:  07/30/2023  7:30 AM  Stanley Leach  has presented today for surgery, with the diagnosis of M79.671 - Right foot pain M76.61 - Achilles tendonitis of right lower extremity.  The various methods of treatment have been discussed with the patient.  No guarantees were given.  After consideration of risks, benefits and other options for treatment, the patient has consented to surgery.  I have reviewed the patients' chart and labs.     A history and physical examination was performed in my office.  The patient was reexamined.  There have been no changes to this history and physical examination.  Stanley Leach A

## 2023-07-30 NOTE — Op Note (Signed)
Operative note   Surgeon:Minka Knight [employer] logistics/support/administrative officer: None    Preop diagnosis: 1.  Achilles insertional tendinitis 2.  Posterior calcaneal exostosis right lower extremity    Postop diagnosis: Same    Procedure: 1.  Secondary Achilles repair posterior right heel 2.  Posterior calcaneal exostectomy 3.  Intraoperative fluoroscopy without assistance of radiologist    EBL: None    Anesthesia:local and general.  Local consisted of a one-to-one mixture of 0.25% bupivacaine with with epinephrine and Exparel long-acting anesthetic.  A total of 20 cc was used    Hemostasis: Bupivacaine with epinephrine    Specimen: None    Complications: None    Operative indications:Stanley Leach is an 52 y.o. that presents today for surgical intervention.  The risks/benefits/alternatives/complications have been discussed and consent has been given.    Procedure:  Patient was brought into the OR and placed on the operating table in theprone position. After anesthesia was obtained theright lower extremity was prepped and draped in usual sterile fashion.  Attention was directed to the posterior aspect of the right lower extremity where a longitudinal tendon splitting incision was performed.  Sharp and blunt dissection carried down to the peritenon.  The peritenon was then incised and reflected.  A tendon splitting incision was made at the superior aspect of the calcaneus to the very distal aspect of the calcaneus.  Dissection was carried out both medial and lateral and the Achilles tendon was dissected off the posterior calcaneus.  Intraoperative fluoroscopy revealed a large posterior calcaneal exostosis as was demonstrated grossly.  With an osteotome I was able to remove the large posterior calcaneal exostosis.  The area was then further contoured with a combination of rongeur and a power rasp.  The posterior superior aspect of the calcaneus was also recontoured.  The wound was flushed with copious amounts  irrigation.  Fluoroscopy revealed good bone spur removal throughout the entire posterior aspect of the heel.  At this time attention was directed to the distal Achilles.  Noted thickened fibrotic tissue was removed at this time sharply.  The tendon was then micro debrided with a Topaz wand.  Final flushing of the incision and wound site was performed.  The tendon splitting incision was reanastomosed with a 3-0 Vicryl both deeper and superficially.  Next preparation for the bone anchor was made.  The FiberWire was sutured from distal to proximal and back to distal with a Krakw type of suture.  The footprint 5.5 mm bone anchor was then placed into the posterior calcaneus using standard technique and the tendon was held nicely against the posterior calcaneus.  Good excursion of the foot was noted with compression of the calf.  All wounds were flushed 1 last time.  The the peritenon was closed with a 3-0 Vicryl as was the subcutaneous tissue.  The skin was then closed with a 4-0 Monocryl.  Bulky sterile dressing was applied.  The patient was placed in an equalizer walker boot in neutral position.    Patient tolerated the procedure and anesthesia well.  Was transported from the OR to the PACU with all vital signs stable and vascular status intact. To be discharged per routine protocol.  Will follow up in approximately 1 week in the outpatient clinic.

## 2024-04-19 ENCOUNTER — Emergency Department

## 2024-04-19 ENCOUNTER — Encounter: Payer: Self-pay | Admitting: Emergency Medicine

## 2024-04-19 ENCOUNTER — Other Ambulatory Visit: Payer: Self-pay

## 2024-04-19 ENCOUNTER — Emergency Department
Admission: EM | Admit: 2024-04-19 | Discharge: 2024-04-19 | Discharge status: Against Medical Advice | Attending: Emergency Medicine | Admitting: Emergency Medicine

## 2024-04-19 DIAGNOSIS — Z5321 Procedure and treatment not carried out due to patient leaving prior to being seen by health care provider: Secondary | ICD-10-CM | POA: Insufficient documentation

## 2024-04-19 DIAGNOSIS — M79671 Pain in right foot: Secondary | ICD-10-CM | POA: Diagnosis present

## 2024-04-19 NOTE — ED Notes (Signed)
 While walking back to room, patient states he has no intentions of being seen, states, I was just proving a point.  Patient walked into room 54 momentarily, only to walk out to room 42 which is where his mother is the patient.

## 2024-04-19 NOTE — ED Notes (Signed)
 Patient is sitting in room 42. Patient mom is in that room. States I have changed my mind. Patient no longer wants to be seen.

## 2024-04-19 NOTE — ED Triage Notes (Signed)
 Pt in via POV, reports right heel pain x few days, sharp pain when stepping down.  Previous surgery November, 2024.  Denies any recent injury, ambulatory to triage without difficulty.  NAD noted at this time.

## 2024-05-26 ENCOUNTER — Other Ambulatory Visit: Payer: Self-pay | Admitting: Sports Medicine

## 2024-05-26 DIAGNOSIS — M25522 Pain in left elbow: Secondary | ICD-10-CM

## 2024-05-26 DIAGNOSIS — M7712 Lateral epicondylitis, left elbow: Secondary | ICD-10-CM

## 2024-06-05 ENCOUNTER — Other Ambulatory Visit: Payer: Self-pay | Admitting: Sports Medicine

## 2024-06-05 DIAGNOSIS — M7712 Lateral epicondylitis, left elbow: Secondary | ICD-10-CM

## 2024-06-05 DIAGNOSIS — M25522 Pain in left elbow: Secondary | ICD-10-CM

## 2024-06-05 DIAGNOSIS — M778 Other enthesopathies, not elsewhere classified: Secondary | ICD-10-CM

## 2024-06-06 ENCOUNTER — Ambulatory Visit
Admission: RE | Admit: 2024-06-06 | Discharge: 2024-06-06 | Disposition: A | Discharge status: Home / Self Care | Source: Ambulatory Visit | Attending: Sports Medicine | Admitting: Sports Medicine

## 2024-06-06 DIAGNOSIS — M25522 Pain in left elbow: Secondary | ICD-10-CM

## 2024-06-06 DIAGNOSIS — M778 Other enthesopathies, not elsewhere classified: Secondary | ICD-10-CM

## 2024-06-06 DIAGNOSIS — M7712 Lateral epicondylitis, left elbow: Secondary | ICD-10-CM

## 2024-06-07 ENCOUNTER — Ambulatory Visit

## 2024-06-07 ENCOUNTER — Other Ambulatory Visit

## 2024-07-24 ENCOUNTER — Ambulatory Visit

## 2024-07-24 DIAGNOSIS — K219 Gastro-esophageal reflux disease without esophagitis: Secondary | ICD-10-CM | POA: Diagnosis not present

## 2024-07-24 DIAGNOSIS — D123 Benign neoplasm of transverse colon: Secondary | ICD-10-CM | POA: Diagnosis not present

## 2024-07-24 DIAGNOSIS — K5730 Diverticulosis of large intestine without perforation or abscess without bleeding: Secondary | ICD-10-CM | POA: Diagnosis not present

## 2024-07-24 DIAGNOSIS — Z1211 Encounter for screening for malignant neoplasm of colon: Secondary | ICD-10-CM | POA: Diagnosis present

## 2024-07-24 DIAGNOSIS — Z83719 Family history of colon polyps, unspecified: Secondary | ICD-10-CM | POA: Diagnosis not present

## 2024-07-26 ENCOUNTER — Ambulatory Visit: Admit: 2024-07-26 | Admitting: Surgery

## 2024-07-26 SURGERY — REPAIR, TENDON, EXTENSOR
Anesthesia: Choice | Site: Elbow | Laterality: Left
